# Patient Record
Sex: Female | Born: 1955 | Race: White | Hispanic: No | Marital: Married | Smoking: Never smoker
Health system: Southern US, Community
[De-identification: ages and names within clinical notes are randomized; demographics above are authoritative.]

## PROBLEM LIST (undated history)

## (undated) DIAGNOSIS — I10 Essential (primary) hypertension: Secondary | ICD-10-CM

## (undated) HISTORY — PX: TONSILLECTOMY: SUR1361

## (undated) HISTORY — DX: Essential (primary) hypertension: I10

---

## 1998-01-06 ENCOUNTER — Encounter: Admission: RE | Admit: 1998-01-06 | Discharge: 1998-01-06 | Payer: Self-pay | Admitting: Family Medicine

## 1998-06-09 ENCOUNTER — Encounter: Admission: RE | Admit: 1998-06-09 | Discharge: 1998-06-09 | Payer: Self-pay | Admitting: Family Medicine

## 1998-06-30 ENCOUNTER — Encounter: Admission: RE | Admit: 1998-06-30 | Discharge: 1998-06-30 | Payer: Self-pay | Admitting: Family Medicine

## 1999-10-28 ENCOUNTER — Encounter: Admission: RE | Admit: 1999-10-28 | Discharge: 1999-10-28 | Payer: Self-pay | Admitting: Family Medicine

## 2000-09-27 ENCOUNTER — Encounter: Admission: RE | Admit: 2000-09-27 | Discharge: 2000-09-27 | Payer: Self-pay | Admitting: Family Medicine

## 2000-10-01 ENCOUNTER — Encounter: Payer: Self-pay | Admitting: *Deleted

## 2000-10-01 ENCOUNTER — Ambulatory Visit (HOSPITAL_COMMUNITY): Admission: RE | Admit: 2000-10-01 | Discharge: 2000-10-01 | Payer: Self-pay | Admitting: *Deleted

## 2000-10-04 ENCOUNTER — Encounter: Admission: RE | Admit: 2000-10-04 | Discharge: 2000-10-04 | Payer: Self-pay | Admitting: Family Medicine

## 2001-06-19 ENCOUNTER — Encounter (INDEPENDENT_AMBULATORY_CARE_PROVIDER_SITE_OTHER): Payer: Self-pay | Admitting: *Deleted

## 2001-06-19 LAB — CONVERTED CEMR LAB

## 2001-06-28 ENCOUNTER — Encounter: Payer: Self-pay | Admitting: Family Medicine

## 2001-06-28 ENCOUNTER — Encounter: Admission: RE | Admit: 2001-06-28 | Discharge: 2001-06-28 | Payer: Self-pay | Admitting: Family Medicine

## 2001-07-02 ENCOUNTER — Encounter: Admission: RE | Admit: 2001-07-02 | Discharge: 2001-07-02 | Payer: Self-pay | Admitting: Family Medicine

## 2001-07-02 ENCOUNTER — Encounter: Payer: Self-pay | Admitting: Family Medicine

## 2001-07-17 ENCOUNTER — Encounter: Admission: RE | Admit: 2001-07-17 | Discharge: 2001-07-17 | Payer: Self-pay | Admitting: Family Medicine

## 2002-01-29 ENCOUNTER — Encounter: Admission: RE | Admit: 2002-01-29 | Discharge: 2002-01-29 | Payer: Self-pay | Admitting: Family Medicine

## 2002-07-25 ENCOUNTER — Encounter: Admission: RE | Admit: 2002-07-25 | Discharge: 2002-07-25 | Payer: Self-pay | Admitting: Family Medicine

## 2003-01-21 ENCOUNTER — Encounter: Admission: RE | Admit: 2003-01-21 | Discharge: 2003-01-21 | Payer: Self-pay | Admitting: Family Medicine

## 2003-07-08 ENCOUNTER — Encounter: Admission: RE | Admit: 2003-07-08 | Discharge: 2003-07-08 | Payer: Self-pay | Admitting: Family Medicine

## 2003-12-18 ENCOUNTER — Encounter: Admission: RE | Admit: 2003-12-18 | Discharge: 2003-12-18 | Payer: Self-pay | Admitting: Family Medicine

## 2004-12-21 ENCOUNTER — Ambulatory Visit: Payer: Self-pay | Admitting: Family Medicine

## 2005-01-18 ENCOUNTER — Ambulatory Visit: Payer: Self-pay | Admitting: Family Medicine

## 2006-11-15 DIAGNOSIS — N951 Menopausal and female climacteric states: Secondary | ICD-10-CM

## 2006-11-15 DIAGNOSIS — G43909 Migraine, unspecified, not intractable, without status migrainosus: Secondary | ICD-10-CM | POA: Insufficient documentation

## 2006-11-15 DIAGNOSIS — D259 Leiomyoma of uterus, unspecified: Secondary | ICD-10-CM

## 2006-11-15 DIAGNOSIS — N920 Excessive and frequent menstruation with regular cycle: Secondary | ICD-10-CM

## 2006-11-15 DIAGNOSIS — G44209 Tension-type headache, unspecified, not intractable: Secondary | ICD-10-CM

## 2006-11-16 ENCOUNTER — Encounter (INDEPENDENT_AMBULATORY_CARE_PROVIDER_SITE_OTHER): Payer: Self-pay | Admitting: *Deleted

## 2012-10-18 ENCOUNTER — Other Ambulatory Visit: Payer: Self-pay

## 2017-07-12 ENCOUNTER — Ambulatory Visit (HOSPITAL_COMMUNITY)
Admission: EM | Admit: 2017-07-12 | Discharge: 2017-07-12 | Disposition: A | Discharge status: Home / Self Care | Payer: Self-pay | Attending: Nurse Practitioner | Admitting: Nurse Practitioner

## 2017-07-12 ENCOUNTER — Encounter (HOSPITAL_COMMUNITY): Payer: Self-pay | Admitting: Family Medicine

## 2017-07-12 DIAGNOSIS — I160 Hypertensive urgency: Secondary | ICD-10-CM

## 2017-07-12 MED ORDER — LOSARTAN POTASSIUM-HCTZ 50-12.5 MG PO TABS: 1.0000 | ORAL_TABLET | Freq: Every day | ORAL | 0 refills | Status: DC

## 2017-07-12 MED ORDER — ATENOLOL 25 MG PO TABS
25.0000 mg | ORAL_TABLET | Freq: Once | ORAL | Status: AC
  Administered 2017-07-12: 25 mg via ORAL

## 2017-07-12 MED ORDER — CLONIDINE HCL 0.1 MG PO TABS
0.2000 mg | ORAL_TABLET | Freq: Once | ORAL | Status: AC
  Administered 2017-07-12: 0.2 mg via ORAL

## 2017-07-12 MED ORDER — CLONIDINE HCL 0.1 MG PO TABS
ORAL_TABLET | ORAL | Status: AC
  Filled 2017-07-12: qty 1

## 2017-07-12 MED ORDER — ATENOLOL 25 MG PO TABS
ORAL_TABLET | ORAL | Status: AC
  Filled 2017-07-12: qty 1

## 2017-07-12 NOTE — Discharge Instructions (Signed)
Start your high blood pressure medication tomorrow morning. Check your blood pressure at least twice daily and create a log. Take the log with you to your doctor's office. Return here or go to the ED if the top number of your blood pressure is higher than 180. Go to the emergency department immediately if you experience extreme dizziness, severe headache, weakness, difficulty speaking or any other concerning symptoms.

## 2017-07-12 NOTE — ED Provider Notes (Signed)
Paula Ali    CSN: [id number] Arrival date & time: 07/12/17  1547     History   Chief Complaint Chief Complaint  Patient presents with  . Hypertension    HPI Paula Ali is a 61 y.o. female.     Patient presents for evaluation of elevated BP measurement. Patient reports that she has never been officially diagnosed with hypertension but monitors her blood pressure closely. She checks her BP 2-3 times daily. Normally her systolic blood pressure runs in the 140s to 150s. Over the past few days, she's noticed that her systolic blood pressure has significantly increased up to the 200s. She endorses a mild headache. Denies blurred vision, chest pain, dyspnea, palpitations, peripheral edema or limb paresthesias. Cardiovascular risk factors are none. Family history is positive for hypertension and cardiovascular disease.         History reviewed. No pertinent past medical history.  Patient Active Problem List   Diagnosis Date Noted  . 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 TENSION HEADACHE 11/15/2006  . MIGRAINE, UNSPEC., W/O INTRACTABLE MIGRAINE 11/15/2006  . MENORRHAGIA 11/15/2006  . MENOPAUSAL SYNDROME 11/15/2006    History reviewed. No pertinent surgical history.  OB History    No data available       Home Medications    Prior to Admission medications   Not on File    Family History History reviewed. No pertinent family history.  Social History Social History  Substance Use Topics  . Smoking status: Not on file  . Smokeless tobacco: Not on file  . Alcohol use Not on file     Allergies   Amoxicillin   Review of Systems Review of Systems   Physical Exam Triage Vital Signs ED Triage Vitals [07/12/17 1610]  Enc Vitals Group     BP (!) 232/126     Pulse Rate (!) 103     Resp 18     Temp 98 F (36.7 C)     Temp src      SpO2 97 %     Weight      Height      Head Circumference      Peak Flow      Pain Score      Pain Loc    Pain Edu?      Excl. in [place]?    No data found.   Updated Vital Signs BP (!) 232/126   Pulse (!) 103   Temp 98 F (36.7 C)   Resp 18   SpO2 97%   Visual Acuity Right Eye Distance:   Left Eye Distance:   Bilateral Distance:    Right Eye Near:   Left Eye Near:    Bilateral Near:     Physical Exam   UC Treatments / Results  Labs (all labs ordered are listed, but only abnormal results are displayed) Labs Reviewed - No data to display  EKG  EKG Interpretation None       Radiology No results found.  Procedures Procedures (including critical care time)  Medications Ordered in UC Medications  cloNIDine (CATAPRES) tablet 0.2 mg (0.2 mg Oral Given 07/12/17 1616)     Initial Impression / Assessment and Plan / UC Course  I have reviewed the triage vital signs and the nursing notes.  Pertinent labs & imaging results that were available during my care of the patient were reviewed by me and considered in my medical decision making (see chart for details).     61 year old  female presenting with elevated BPs. No prior history of hypertension. BP at clinic arrival was 232/126. She was given clonidine 0.2 mg PO and atenolol 25 mg PO. Her blood pressure is coming down some but still significantly elevated. Advised the patient that she may need to go to the emergency department for IV medications for treatment of hypertensive urgency. The patient is uninsured and reports that she cannot afford to go to the emergency department. We will recheck the blood pressure in 30 minutes and reassess. BP now down to 168/93. Will discharge home with close outpatient follow-up.  The patient was encouraged to continue checking her blood pressure at least twice daily and creating a log. She will be prescribed losartan/hydrochlorothiazide 50/12.5 mg PO daily. She does not have a PCP. She was [facility] to [facility] to establish primary care. She was advised to report to the  emergency room or urgent care immediately should her systolic blood pressure be higher than 180 in spite of medications. Discussed cardiovascular and stroke risk factors. Advised to report to the emergency room immediately should she have any unilateral weakness, slurred speech, facial droop, extreme dizziness, severe headaches or any concerning symptoms.  Discussed diagnosis and treatment with patient. All questions have been answered and all concerns have been addressed. The patient verbalized understanding and had no further questions   Final Clinical Impressions(s) / UC Diagnoses   Final diagnoses:  Hypertensive urgency    New Prescriptions New Prescriptions   No medications on file     Controlled Substance Prescriptions [place] Controlled Substance Registry consulted? Not Applicable   Paula Ali, [place] 07/12/17 1728

## 2017-07-12 NOTE — ED Triage Notes (Signed)
Pt here for hypertension. Reports that she has been recording it today and she has multiple readings over 395 systolic. Reports her normal is around 14o sys. sts that she was dizzy today which made her check it multiple times.  Reports she doesn't take any BP meds. Denies chest pain, SOB.

## 2017-07-25 ENCOUNTER — Encounter: Payer: Self-pay | Admitting: Physician Assistant

## 2017-07-25 ENCOUNTER — Ambulatory Visit: Payer: Self-pay | Attending: Internal Medicine | Admitting: Physician Assistant

## 2017-07-25 VITALS — BP 171/76 | HR 91 | Temp 98.3°F | Resp 18 | Ht 67.0 in | Wt 162.4 lb

## 2017-07-25 DIAGNOSIS — I10 Essential (primary) hypertension: Secondary | ICD-10-CM | POA: Insufficient documentation

## 2017-07-25 DIAGNOSIS — Z5189 Encounter for other specified aftercare: Secondary | ICD-10-CM | POA: Insufficient documentation

## 2017-07-25 DIAGNOSIS — Z880 Allergy status to penicillin: Secondary | ICD-10-CM | POA: Insufficient documentation

## 2017-07-25 MED ORDER — LOSARTAN POTASSIUM-HCTZ 100-25 MG PO TABS: 1.0000 | ORAL_TABLET | Freq: Every day | ORAL | 3 refills | Status: DC

## 2017-07-25 NOTE — Patient Instructions (Addendum)
Check blood pressure daily and record and bring recordings to your next visit.     Hypertension Hypertension, commonly called high blood pressure, is when the force of blood pumping through the arteries is too strong. The arteries are the blood vessels that carry blood from the heart throughout the body. Hypertension forces the heart to work harder to pump blood and may cause arteries to become narrow or stiff. Having untreated or uncontrolled hypertension can cause heart attacks, strokes, kidney disease, and other problems. A blood pressure reading consists of a higher number over a lower number. Ideally, your blood pressure should be below 120/80. The first ("top") number is called the systolic pressure. It is a measure of the pressure in your arteries as your heart beats. The second ("bottom") number is called the diastolic pressure. It is a measure of the pressure in your arteries as the heart relaxes. What are the causes? The cause of this condition is not known. What increases the risk? Some risk factors for high blood pressure are under your control. Others are not. Factors you can change  Smoking.  Having type 2 diabetes mellitus, high cholesterol, or both.  Not getting enough exercise or physical activity.  Being overweight.  Having too much fat, sugar, calories, or salt (sodium) in your diet.  Drinking too much alcohol. Factors that are difficult or impossible to change  Having chronic kidney disease.  Having a family history of high blood pressure.  Age. Risk increases with age.  Race. You may be at higher risk if you are African-American.  Gender. Men are at higher risk than women before age 9. After age 32, women are at higher risk than men.  Having obstructive sleep apnea.  Stress. What are the signs or symptoms? Extremely high blood pressure (hypertensive crisis) may cause:  Headache.  Anxiety.  Shortness of breath.  Nosebleed.  Nausea and  vomiting.  Severe chest pain.  Jerky movements you cannot control (seizures).  How is this diagnosed? This condition is diagnosed by measuring your blood pressure while you are seated, with your arm resting on a surface. The cuff of the blood pressure monitor will be placed directly against the skin of your upper arm at the level of your heart. It should be measured at least twice using the same arm. Certain conditions can cause a difference in blood pressure between your right and left arms. Certain factors can cause blood pressure readings to be lower or higher than normal (elevated) for a short period of time:  When your blood pressure is higher when you are in a health care provider's office than when you are at home, this is called white coat hypertension. Most people with this condition do not need medicines.  When your blood pressure is higher at home than when you are in a health care provider's office, this is called masked hypertension. Most people with this condition may need medicines to control blood pressure.  If you have a high blood pressure reading during one visit or you have normal blood pressure with other risk factors:  You may be asked to return on a different day to have your blood pressure checked again.  You may be asked to monitor your blood pressure at home for 1 week or longer.  If you are diagnosed with hypertension, you may have other blood or imaging tests to help your health care provider understand your overall risk for other conditions. How is this treated? This condition is treated by  making healthy lifestyle changes, such as eating healthy foods, exercising more, and reducing your alcohol intake. Your health care provider may prescribe medicine if lifestyle changes are not enough to get your blood pressure under control, and if:  Your systolic blood pressure is above 130.  Your diastolic blood pressure is above 80.  Your personal target blood pressure  may vary depending on your medical conditions, your age, and other factors. Follow these instructions at home: Eating and drinking  Eat a diet that is high in fiber and potassium, and low in sodium, added sugar, and fat. An example eating plan is called the DASH (Dietary Approaches to Stop Hypertension) diet. To eat this way: ? Eat plenty of fresh fruits and vegetables. Try to fill half of your plate at each meal with fruits and vegetables. ? Eat whole grains, such as whole wheat pasta, brown rice, or whole grain bread. Fill about one quarter of your plate with whole grains. ? Eat or drink low-fat dairy products, such as skim milk or low-fat yogurt. ? Avoid fatty cuts of meat, processed or cured meats, and poultry with skin. Fill about one quarter of your plate with lean proteins, such as fish, chicken without skin, beans, eggs, and tofu. ? Avoid premade and processed foods. These tend to be higher in sodium, added sugar, and fat.  Reduce your daily sodium intake. Most people with hypertension should eat less than 1,500 mg of sodium a day.  Limit alcohol intake to no more than 1 drink a day for nonpregnant women and 2 drinks a day for men. One drink equals 12 oz of beer, 5 oz of wine, or 1 oz of hard liquor. Lifestyle  Work with your health care provider to maintain a healthy body weight or to lose weight. Ask what an ideal weight is for you.  Get at least 30 minutes of exercise that causes your heart to beat faster (aerobic exercise) most days of the week. Activities may include walking, swimming, or biking.  Include exercise to strengthen your muscles (resistance exercise), such as pilates or lifting weights, as part of your weekly exercise routine. Try to do these types of exercises for 30 minutes at least 3 days a week.  Do not use any products that contain nicotine or tobacco, such as cigarettes and e-cigarettes. If you need help quitting, ask your health care provider.  Monitor your  blood pressure at home as told by your health care provider.  Keep all follow-up visits as told by your health care provider. This is important. Medicines  Take over-the-counter and prescription medicines only as told by your health care provider. Follow directions carefully. Blood pressure medicines must be taken as prescribed.  Do not skip doses of blood pressure medicine. Doing this puts you at risk for problems and can make the medicine less effective.  Ask your health care provider about side effects or reactions to medicines that you should watch for. Contact a health care provider if:  You think you are having a reaction to a medicine you are taking.  You have headaches that keep coming back (recurring).  You feel dizzy.  You have swelling in your ankles.  You have trouble with your vision. Get help right away if:  You develop a severe headache or confusion.  You have unusual weakness or numbness.  You feel faint.  You have severe pain in your chest or abdomen.  You vomit repeatedly.  You have trouble breathing. Summary  Hypertension is  when the force of blood pumping through your arteries is too strong. If this condition is not controlled, it may put you at risk for serious complications.  Your personal target blood pressure may vary depending on your medical conditions, your age, and other factors. For most people, a normal blood pressure is less than 120/80.  Hypertension is treated with lifestyle changes, medicines, or a combination of both. Lifestyle changes include weight loss, eating a healthy, low-sodium diet, exercising more, and limiting alcohol. This information is not intended to replace advice given to you by your health care provider. Make sure you discuss any questions you have with your health care provider. Document Released: 09/04/2005 Document Revised: 08/02/2016 Document Reviewed: 08/02/2016 Elsevier Interactive Patient Education  [employer].

## 2017-07-25 NOTE — Progress Notes (Addendum)
Patient ID: Paula Ali, female   DOB: 09-Sep-1956, 61 y.o.   MRN: [id number]   Paula Ali, is a 61 y.o. female  QQP:[id number]  [id number]  DOB - [birth year]  Subjective:  Chief Complaint and HPI: Paula Ali is a 61 y.o. female here today to establish care and for a follow up visit After being seen in the [facility] 07/12/2017 for very high BP readings and undiagnosed Htn.  She was initially given atenolol and Clonidine and her BP started to come down(232/126 initially down to 168/93).  She was prescribed losartan/hydrochlorothiazide 50/12.5 mg PO daily and told to monitor her BP at home and f/up here.   Prior to a few weeks ago, she had been watching her BP for a couple of years.  She usu got readings 140-160/80s-90s.  Then the readings became even higher over a a few days which is when she went to an Urgent care for HTN.  She denies CP/HA/Dizziness.  She has been tolerating the Losartan/HCT 50/12.5 w/o adverse effects.  BP readings at home now 140s-160/70-90; pulse 65-90.  She has not had a PCP in a long time.    ED/Hospital notes reviewed.   +FH CVD; htn  ROS:   Constitutional:  No f/c, No night sweats, No unexplained weight loss. EENT:  No vision changes, No blurry vision, No hearing changes. No mouth, throat, or ear problems.  Respiratory: No cough, No SOB Cardiac: No CP, no palpitations GI:  No abd pain, No N/V/D. GU: No Urinary s/sx Musculoskeletal: No joint pain Neuro: No headache, no dizziness, no motor weakness.  Skin: No rash Endocrine:  No polydipsia. No polyuria.  Psych: Denies SI/HI  No problems updated.  ALLERGIES: Allergies  Allergen Reactions  . Amoxicillin     PAST MEDICAL HISTORY: Past Medical History:  Diagnosis Date  . Hypertension     MEDICATIONS AT HOME: Prior to Admission medications   Medication Sig Start Date End Date Taking? Authorizing Provider  losartan-hydrochlorothiazide (HYZAAR) 100-25 MG tablet Take 1 tablet daily by  mouth. 07/25/17   Argentina Donovan, PA-C     Objective:  EXAM:   Vitals:   07/25/17 1451  BP: (!) 171/76  Pulse: 91  Resp: 18  Temp: 98.3 F (36.8 C)  TempSrc: Oral  SpO2: 91%  Weight: 162 lb 6.4 oz (73.7 kg)  Height: 5\' 7"  (1.702 m)    General appearance : A&OX3. NAD. Non-toxic-appearing HEENT: Atraumatic and Normocephalic.  PERRLA. EOM intact.  Neck: supple, no JVD. No cervical lymphadenopathy. No thyromegaly Chest/Lungs:  Breathing-non-labored, Good air entry bilaterally, breath sounds normal without rales, rhonchi, or wheezing  CVS: S1 S2 regular, no murmurs, gallops, rubs  Extremities: Bilateral Lower Ext shows no edema, both legs are warm to touch with = pulse throughout Neurology:  CN II-XII grossly intact, Non focal.   Psych:  TP linear. J/I WNL. Normal speech. Appropriate eye contact and affect.  Skin:  No Rash  Data Review No results found for: HGBA1C   Assessment & Plan   1. Hypertension, unspecified type Uncontrolled but improving - Basic metabolic panel - TSH Increase dose- losartan-hydrochlorothiazide (HYZAAR) 100-25 MG tablet; Take 1 tablet daily by mouth.  Dispense: 90 tablet; Refill: 3 Continue to check BP OOO and record daily.  Bring to your next visit.   We have discussed target BP range and blood pressure goal. I have advised patient to check BP regularly and to call us back or report to clinic if the numbers are consistently  higher than 140/90. We discussed the importance of compliance with medical therapy and DASH diet recommended, consequences of uncontrolled hypertension discussed.   Patient have been counseled extensively about nutrition and exercise  Return in about 4 weeks (around 08/22/2017) for establish care/assign PCP; f/up htn.  The patient was given clear instructions to go to ER or return to medical center if symptoms don't improve, worsen or new problems develop. The patient verbalized understanding. The patient was told to call to get  lab results if they haven't heard anything in the next week.     Freeman Caldron, PA-C [facility] and [facility], [place]   07/25/2017, 3:38 PM

## 2017-07-26 LAB — BASIC METABOLIC PANEL
BUN/Creatinine Ratio: 17 (ref 12–28)
BUN: 11 mg/dL (ref 8–27)
CALCIUM: 9.7 mg/dL (ref 8.7–10.3)
CO2: 25 mmol/L (ref 20–29)
CREATININE: 0.64 mg/dL (ref 0.57–1.00)
Chloride: 95 mmol/L — ABNORMAL LOW (ref 96–106)
GFR calc Af Amer: 111 mL/min/{1.73_m2} (ref >59)
GFR calc non Af Amer: 96 mL/min/{1.73_m2} (ref >59)
GLUCOSE: 91 mg/dL (ref 65–99)
Potassium: 4.1 mmol/L (ref 3.5–5.2)
Sodium: 132 mmol/L — ABNORMAL LOW (ref 134–144)

## 2017-07-26 LAB — TSH: TSH: 1.5 u[IU]/mL (ref 0.450–4.500)

## 2017-07-27 ENCOUNTER — Other Ambulatory Visit: Payer: Self-pay | Admitting: Pharmacist

## 2017-07-27 DIAGNOSIS — I10 Essential (primary) hypertension: Secondary | ICD-10-CM

## 2017-07-27 MED ORDER — LOSARTAN POTASSIUM-HCTZ 100-25 MG PO TABS: 1.0000 | ORAL_TABLET | Freq: Every day | ORAL | 3 refills | Status: DC

## 2017-07-27 MED FILL — LOSARTAN-HCTZ 100-25 MG TAB: 100-25 | 30 days supply | Qty: 30 | Fill #0

## 2017-07-31 ENCOUNTER — Telehealth (INDEPENDENT_AMBULATORY_CARE_PROVIDER_SITE_OTHER): Payer: Self-pay | Admitting: *Deleted

## 2017-07-31 NOTE — Telephone Encounter (Signed)
Medical Assistant left message on patient's home and cell voicemail. Voicemail states to give a call back to [place] with [facility] at [phone]. Patient is aware of sodium beng low and monitored. Patient is aware of all other labs being normal.

## 2017-07-31 NOTE — Telephone Encounter (Signed)
-----   Message from Argentina Donovan, [place] sent at 07/26/2017  8:36 AM EST ----- Please call patient.  Her sodium was a little low.  Nothing needs to be added/done for this. We will continue to monitor this.  Her potassium, blood sugar, kidney function, and thyroid studies were all normal.  Follow-up as planned. Thanks, Freeman Caldron, PA-C

## 2017-08-24 ENCOUNTER — Encounter: Payer: Self-pay | Admitting: Nurse Practitioner

## 2017-08-24 ENCOUNTER — Ambulatory Visit: Payer: Self-pay | Attending: Nurse Practitioner | Admitting: Nurse Practitioner

## 2017-08-24 VITALS — BP 168/77 | HR 91 | Temp 97.8°F | Ht 67.0 in | Wt 160.4 lb

## 2017-08-24 DIAGNOSIS — Z8249 Family history of ischemic heart disease and other diseases of the circulatory system: Secondary | ICD-10-CM | POA: Insufficient documentation

## 2017-08-24 DIAGNOSIS — Z79899 Other long term (current) drug therapy: Secondary | ICD-10-CM | POA: Insufficient documentation

## 2017-08-24 DIAGNOSIS — Z880 Allergy status to penicillin: Secondary | ICD-10-CM | POA: Insufficient documentation

## 2017-08-24 DIAGNOSIS — L409 Psoriasis, unspecified: Secondary | ICD-10-CM | POA: Insufficient documentation

## 2017-08-24 DIAGNOSIS — Z0000 Encounter for general adult medical examination without abnormal findings: Secondary | ICD-10-CM

## 2017-08-24 DIAGNOSIS — R51 Headache: Secondary | ICD-10-CM | POA: Insufficient documentation

## 2017-08-24 DIAGNOSIS — I10 Essential (primary) hypertension: Secondary | ICD-10-CM | POA: Insufficient documentation

## 2017-08-24 MED ORDER — CLOBETASOL PROPIONATE 0.05 % EX CREA: 1.0000 "application " | TOPICAL_CREAM | Freq: Two times a day (BID) | CUTANEOUS | 1 refills | Status: DC

## 2017-08-24 MED ORDER — AMLODIPINE BESYLATE 5 MG PO TABS: 5.0000 mg | ORAL_TABLET | Freq: Every day | ORAL | 1 refills | Status: DC

## 2017-08-24 NOTE — Progress Notes (Signed)
Assessment & Plan:  Paula Ali was seen today for follow-up.  Diagnoses and all orders for this visit:  Essential hypertension -     CBC -     CMP14+EGFR -     Lipid panel -     amLODipine (NORVASC) 5 MG tablet; Take 1 tablet (5 mg total) by mouth daily. Continue all antihypertensives as prescribed.  Remember to bring in your blood pressure log with you for your follow up appointment.  DASH/Mediterranean Diets are healthier choices for HTN.   Routine health maintenance -     VITAMIN D 25 Hydroxy (Vit-D Deficiency, Fractures)  Psoriasis -     clobetasol cream (TEMOVATE) 0.05 %; Apply 1 application topically 2 (two) times daily.    Patient has been counseled on age-appropriate routine health concerns for screening and prevention. These are reviewed and up-to-date. Referrals have been placed accordingly. Immunizations are up-to-date or declined.   Patient states "I just want to work on my blood pressure right now. Let's hold off on all these referrals".  Subjective:   Chief Complaint  Patient presents with  . Follow-up    Patient is here for follow-up for blood pressure. Patient would like to establish care.    HPI Paula Ali 61 y.o. female presents to office today to establish care and for medication refills.    Hypertension Patient reports she has been "somewhat" hypertensive for the past 5 years but never diagnosed. She has attempted to control her blood pressure with exercise and alternative therapies including: hawthorn supplements and beets. Unfortunately her blood pressure has fluctuated over the years with average 140-150/80s. She has a blood pressure log with her today with multiple readings each day since she was placed on losartan-hctz on 07-12-2017. On that day she had been evaluated in the ED for hypertensive urgency after complaining of headaches. She has no significant PMH aside from psoriasis. [facility] is positive for HTN in her mother. She does state her mother  was eventually taken off of her blood pressure medication. While in the ED she was treated with clonodine and atenolol and discharged home on losartan/hctz 50/12.5 and instructions to establish care with a provider and follow up for her blood pressure. She was seen in this office by another provider on 07-25-2017 and was noted for continued HTN. Losartan/Hctz was then increased to 100/25. Today she is here for follow up to that office visit and to establish care with me. She endorses dry mouth (likely from blood pressure medication.).  Drinks gatorade and reports symptoms of dry mouth resolve. She was hyponatremic in the ED so will recheck electrolytes. As she is still not at goal with her BP today will add a small dose of amlodipine. She is not a smoker.   Psoriasis Patient complains of psoriasis.  Symptoms have been ongoing for about a few years and have been stable. The patient reports symptoms of itching, scaling, primarily affecting the scalp, hands. Lesions appear to be exacerbated by no known precipitant. Treatments tried so far include topical steroid (clobetasol), result clearing, with good improvement. History of other significant skin problems: no. Family History of skin disease: no.   Review of Systems  Constitutional: Negative for fever, malaise/fatigue and weight loss.  HENT: Negative.  Negative for nosebleeds.   Eyes: Negative.  Negative for blurred vision, double vision and photophobia.  Respiratory: Negative.  Negative for cough and shortness of breath.   Cardiovascular: Negative.  Negative for chest pain, palpitations and leg swelling.  Gastrointestinal: Negative.  Negative for abdominal pain, constipation, diarrhea, heartburn, nausea and vomiting.  Musculoskeletal: Negative.  Negative for myalgias.  Neurological: Negative.  Negative for dizziness, focal weakness, seizures and headaches.  Endo/Heme/Allergies: Negative for environmental allergies.  Psychiatric/Behavioral: Negative.   Negative for suicidal ideas.    Past Medical History:  Diagnosis Date  . Hypertension     History reviewed. No pertinent surgical history.  History reviewed. No pertinent family history.  Social History Reviewed with no changes to be made today.   Outpatient Medications Prior to Visit  Medication Sig Dispense Refill  . losartan-hydrochlorothiazide (HYZAAR) 100-25 MG tablet Take 1 tablet daily by mouth. 90 tablet 3   No facility-administered medications prior to visit.     Allergies  Allergen Reactions  . Amoxicillin        Objective:    BP (!) 168/77 (BP Location: Left Arm, Patient Position: Sitting, Cuff Size: Normal)   Pulse 91   Temp 97.8 F (36.6 C) (Oral)   Ht 5' 7" (1.702 m)   Wt 160 lb 6.4 oz (72.8 kg)   SpO2 100%   BMI 25.12 kg/m  Wt Readings from Last 3 Encounters:  08/24/17 160 lb 6.4 oz (72.8 kg)  07/25/17 162 lb 6.4 oz (73.7 kg)    Physical Exam  Constitutional: She is oriented to person, place, and time. She appears well-developed and well-nourished. She is cooperative.  HENT:  Head: Normocephalic and atraumatic.    Eyes: EOM are normal.  Neck: Normal range of motion.  Cardiovascular: Normal rate, regular rhythm, normal heart sounds and intact distal pulses. Exam reveals no gallop and no friction rub.  No murmur heard. Pulmonary/Chest: Effort normal and breath sounds normal. No tachypnea. No respiratory distress. She has no decreased breath sounds. She has no wheezes. She has no rhonchi. She has no rales. She exhibits no tenderness.  Abdominal: Soft. Bowel sounds are normal.  Musculoskeletal: Normal range of motion. She exhibits no edema.  Neurological: She is alert and oriented to person, place, and time. Coordination normal.  Skin: Skin is warm and dry.  Psychiatric: She has a normal mood and affect. Her behavior is normal. Judgment and thought content normal.  Nursing note and vitals reviewed.      Patient has been counseled extensively  about nutrition and exercise as well as the importance of adherence with medications and regular follow-up. The patient was given clear instructions to go to ER or return to medical center if symptoms don't improve, worsen or new problems develop. The patient verbalized understanding.   Follow-up: Return in about 2 weeks (around 09/07/2017) for BP recheck with PCP.   Gildardo Pounds, FNP-BC [facility] [place], [place]   08/24/2017, 1:35 PM

## 2017-08-25 LAB — CMP14+EGFR
A/G RATIO: 2 (ref 1.2–2.2)
ALT: 12 IU/L (ref 0–32)
AST: 15 IU/L (ref 0–40)
Albumin: 4.5 g/dL (ref 3.6–4.8)
Alkaline Phosphatase: 81 IU/L (ref 39–117)
BILIRUBIN TOTAL: 0.4 mg/dL (ref 0.0–1.2)
BUN/Creatinine Ratio: 14 (ref 12–28)
BUN: 11 mg/dL (ref 8–27)
CALCIUM: 9.6 mg/dL (ref 8.7–10.3)
CHLORIDE: 95 mmol/L — AB (ref 96–106)
CO2: 24 mmol/L (ref 20–29)
Creatinine, Ser: 0.77 mg/dL (ref 0.57–1.00)
GFR calc Af Amer: 96 mL/min/{1.73_m2} (ref >59)
GFR, EST NON AFRICAN AMERICAN: 83 mL/min/{1.73_m2} (ref >59)
Globulin, Total: 2.2 g/dL (ref 1.5–4.5)
Glucose: 93 mg/dL (ref 65–99)
POTASSIUM: 4 mmol/L (ref 3.5–5.2)
Sodium: 131 mmol/L — ABNORMAL LOW (ref 134–144)
Total Protein: 6.7 g/dL (ref 6.0–8.5)

## 2017-08-25 LAB — CBC
Hematocrit: 37 % (ref 34.0–46.6)
Hemoglobin: 12.8 g/dL (ref 11.1–15.9)
MCH: 31.7 pg (ref 26.6–33.0)
MCHC: 34.6 g/dL (ref 31.5–35.7)
MCV: 92 fL (ref 79–97)
PLATELETS: 236 10*3/uL (ref 150–379)
RBC: 4.04 x10E6/uL (ref 3.77–5.28)
RDW: 13 % (ref 12.3–15.4)
WBC: 5.2 10*3/uL (ref 3.4–10.8)

## 2017-08-25 LAB — LIPID PANEL
CHOLESTEROL TOTAL: 208 mg/dL — AB (ref 100–199)
Chol/HDL Ratio: 3.5 ratio (ref 0.0–4.4)
HDL: 59 mg/dL (ref >39)
LDL Calculated: 134 mg/dL — ABNORMAL HIGH (ref 0–99)
TRIGLYCERIDES: 76 mg/dL (ref 0–149)
VLDL Cholesterol Cal: 15 mg/dL (ref 5–40)

## 2017-08-25 LAB — VITAMIN D 25 HYDROXY (VIT D DEFICIENCY, FRACTURES): Vit D, 25-Hydroxy: 49.1 ng/mL (ref 30.0–100.0)

## 2017-08-29 ENCOUNTER — Telehealth: Payer: Self-pay

## 2017-08-29 NOTE — Telephone Encounter (Signed)
-----   Message from Gildardo Pounds, NP sent at 08/25/2017 11:43 PM EST ----- Your sodium is still below normal. I would like for you to stop taking the losartan-hctz and continue on the amlodipine/norvasc only. I will follow up with your blood pressure on the 21st. Continue to monitor and log your blood pressures no more than 1-2 times per day.

## 2017-08-29 NOTE — Telephone Encounter (Signed)
Patient informed on lab result and aware to stop taking Losartan-hctz and only amlodipine/norvasc.   Patient verified DOB.

## 2017-09-07 ENCOUNTER — Encounter: Payer: Self-pay | Admitting: Nurse Practitioner

## 2017-09-07 ENCOUNTER — Ambulatory Visit: Payer: Self-pay | Attending: Nurse Practitioner | Admitting: Nurse Practitioner

## 2017-09-07 VITALS — BP 150/84 | HR 90 | Temp 97.8°F | Ht 67.0 in | Wt 162.6 lb

## 2017-09-07 DIAGNOSIS — Z880 Allergy status to penicillin: Secondary | ICD-10-CM | POA: Insufficient documentation

## 2017-09-07 DIAGNOSIS — L409 Psoriasis, unspecified: Secondary | ICD-10-CM

## 2017-09-07 DIAGNOSIS — E871 Hypo-osmolality and hyponatremia: Secondary | ICD-10-CM

## 2017-09-07 DIAGNOSIS — E559 Vitamin D deficiency, unspecified: Secondary | ICD-10-CM | POA: Insufficient documentation

## 2017-09-07 DIAGNOSIS — I10 Essential (primary) hypertension: Secondary | ICD-10-CM

## 2017-09-07 DIAGNOSIS — Z79899 Other long term (current) drug therapy: Secondary | ICD-10-CM | POA: Insufficient documentation

## 2017-09-07 MED ORDER — CLOBETASOL PROPIONATE 0.05 % EX CREA: 1.0000 "application " | TOPICAL_CREAM | Freq: Two times a day (BID) | CUTANEOUS | 1 refills | Status: DC

## 2017-09-07 MED ORDER — METOPROLOL SUCCINATE ER 25 MG PO TB24: 25.0000 mg | ORAL_TABLET | Freq: Every day | ORAL | 1 refills | Status: DC

## 2017-09-07 NOTE — Progress Notes (Signed)
Assessment & Plan:  Paula Ali was seen today for hypertension.  Diagnoses and all orders for this visit:  Hyponatremia -     Basic metabolic panel  Essential hypertension -     metoprolol succinate (TOPROL-XL) 25 MG 24 hr tablet; Take 1 tablet (25 mg total) by mouth daily.  Psoriasis -     clobetasol cream (TEMOVATE) 0.05 %; Apply 1 application topically 2 (two) times daily.    Patient has been counseled on age-appropriate routine health concerns for screening and prevention. These are reviewed and up-to-date. Referrals have been placed accordingly. Immunizations are up-to-date or declined.    Subjective:   Chief Complaint  Patient presents with  . Hypertension    Patient stated her blood pressure has been high since she's been only taking amlodipine. Patient is concern with it.    HPI Paula Ali 61 y.o. female presents to office today for follow up of essential hypertension.   Essential Hypertension Her last office visit with me was on 08-24-2017 to establish care as a new patient and at that time amlodipine was added to her current antihypertensive regimen of hyzaar 100/25mg  due to poorly controlled hypertension despite medication compliance. Blood pressure is still not at goal today. I instructed her at the last office visit on 08-24-2017 to stop hyzaar as she was having increasing hyponatremia. Will recheck sodium today. Blood pressure readings at home have been 140-160/70-80s. Blood pressure is still fairly uncontrolled today. Will add toprol xl. She denies chest pain, shortness of breath, palpitations, lightheadedness, dizziness, headaches or bilateral lower extremity edema.   BP Readings from Last 3 Encounters:  09/07/17 (!) 150/84  08/24/17 (!) 168/77  07/25/17 (!) 171/76    Vitamin D Deficiency She is taking 2000units. Vitamin D is normal as of 08-24-2017.   Review of Systems  Constitutional: Negative for fever, malaise/fatigue and weight loss.  HENT:  Negative.  Negative for nosebleeds.   Eyes: Negative.  Negative for blurred vision, double vision and photophobia.  Respiratory: Negative.  Negative for cough and shortness of breath.   Cardiovascular: Negative.  Negative for chest pain, palpitations and leg swelling.  Gastrointestinal: Negative.  Negative for abdominal pain, constipation, diarrhea, heartburn, nausea and vomiting.  Musculoskeletal: Negative.  Negative for myalgias.  Skin: Positive for itching and rash.  Neurological: Negative.  Negative for dizziness, focal weakness, seizures and headaches.  Endo/Heme/Allergies: Negative for environmental allergies.  Psychiatric/Behavioral: Negative.  Negative for suicidal ideas.    Past Medical History:  Diagnosis Date  . Hypertension     History reviewed. No pertinent surgical history.  History reviewed. No pertinent family history.  Social History Reviewed with no changes to be made today.   Outpatient Medications Prior to Visit  Medication Sig Dispense Refill  . amLODipine (NORVASC) 5 MG tablet Take 1 tablet (5 mg total) by mouth daily. 30 tablet 1  . clobetasol cream (TEMOVATE) 4.62 % Apply 1 application topically 2 (two) times daily. 60 g 1  . losartan-hydrochlorothiazide (HYZAAR) 100-25 MG tablet Take 1 tablet daily by mouth. (Patient not taking: Reported on 09/07/2017) 90 tablet 3   No facility-administered medications prior to visit.     Allergies  Allergen Reactions  . Amoxicillin        Objective:    BP (!) 150/84 (BP Location: Left Arm, Patient Position: Sitting, Cuff Size: Normal)   Pulse 90   Temp 97.8 F (36.6 C) (Oral)   Ht 5\' 7"  (1.702 m)   Wt 162 lb 9.6  oz (73.8 kg)   SpO2 100%   BMI 25.47 kg/m  Wt Readings from Last 3 Encounters:  09/07/17 162 lb 9.6 oz (73.8 kg)  08/24/17 160 lb 6.4 oz (72.8 kg)  07/25/17 162 lb 6.4 oz (73.7 kg)    Physical Exam  Constitutional: She is oriented to person, place, and time. She appears well-developed and  well-nourished. She is cooperative.  HENT:  Head: Normocephalic and atraumatic.  Eyes: EOM are normal.  Neck: Normal range of motion.  Cardiovascular: Normal rate, regular rhythm, normal heart sounds and intact distal pulses. Exam reveals no gallop and no friction rub.  No murmur heard. Pulmonary/Chest: Effort normal and breath sounds normal. No tachypnea. No respiratory distress. She has no decreased breath sounds. She has no wheezes. She has no rhonchi. She has no rales. She exhibits no tenderness.  Abdominal: Soft. Bowel sounds are normal.  Musculoskeletal: Normal range of motion. She exhibits no edema.  Neurological: She is alert and oriented to person, place, and time. Coordination normal.  Skin: Skin is warm and dry.  Psychiatric: She has a normal mood and affect. Her behavior is normal. Judgment and thought content normal.  Nursing note and vitals reviewed.     Patient has been counseled extensively about nutrition and exercise as well as the importance of adherence with medications and regular follow-up. The patient was given clear instructions to go to ER or return to medical center if symptoms don't improve, worsen or new problems develop. The patient verbalized understanding.   Follow-up: Return in about 3 weeks (around 09/28/2017) for BP recheck     Gildardo Pounds, FNP-BC [facility] [place], [place]   09/11/2017, 8:09 PM

## 2017-09-08 LAB — BASIC METABOLIC PANEL
BUN / CREAT RATIO: 12 (ref 12–28)
BUN: 9 mg/dL (ref 8–27)
CHLORIDE: 104 mmol/L (ref 96–106)
CO2: 24 mmol/L (ref 20–29)
CREATININE: 0.78 mg/dL (ref 0.57–1.00)
Calcium: 9.6 mg/dL (ref 8.7–10.3)
GFR calc non Af Amer: 82 mL/min/{1.73_m2} (ref >59)
GFR, EST AFRICAN AMERICAN: 94 mL/min/{1.73_m2} (ref >59)
Glucose: 93 mg/dL (ref 65–99)
Potassium: 4.3 mmol/L (ref 3.5–5.2)
Sodium: 140 mmol/L (ref 134–144)

## 2017-09-19 ENCOUNTER — Telehealth: Payer: Self-pay

## 2017-09-19 NOTE — Telephone Encounter (Signed)
Patient informed on lab result.   Patient verified DOB.

## 2017-09-19 NOTE — Progress Notes (Signed)
Called patient back and informed her PCP message above.

## 2017-09-19 NOTE — Progress Notes (Signed)
Patient stated she is having a weird feeling with her dry mouth. She would like to have her blood sugar check on her next visit. Patient wants to know if she need to fast or anything for it?

## 2017-09-19 NOTE — Telephone Encounter (Signed)
-----   Message from Gildardo Pounds, NP sent at 09/18/2017 10:30 PM EST ----- Please inform patient that laboratory results are normal. Continue healthy eating habit and regular physical exercise at least 150 minutes per week

## 2017-10-05 ENCOUNTER — Encounter: Payer: Self-pay | Admitting: Nurse Practitioner

## 2017-10-05 ENCOUNTER — Ambulatory Visit: Payer: Self-pay | Attending: Nurse Practitioner | Admitting: Nurse Practitioner

## 2017-10-05 VITALS — BP 147/74 | HR 77 | Temp 98.0°F | Ht 67.0 in | Wt 164.2 lb

## 2017-10-05 DIAGNOSIS — Z1211 Encounter for screening for malignant neoplasm of colon: Secondary | ICD-10-CM

## 2017-10-05 DIAGNOSIS — I10 Essential (primary) hypertension: Secondary | ICD-10-CM | POA: Insufficient documentation

## 2017-10-05 DIAGNOSIS — Z880 Allergy status to penicillin: Secondary | ICD-10-CM | POA: Insufficient documentation

## 2017-10-05 DIAGNOSIS — Z79899 Other long term (current) drug therapy: Secondary | ICD-10-CM | POA: Insufficient documentation

## 2017-10-05 MED ORDER — AMLODIPINE BESYLATE 5 MG PO TABS: 5.0000 mg | ORAL_TABLET | Freq: Every day | ORAL | 1 refills | Status: DC

## 2017-10-05 NOTE — Patient Instructions (Addendum)
Hypertension  Hypertension, commonly called high blood pressure, is when the force of blood pumping through the arteries is too strong. The arteries are the blood vessels that carry blood from the heart throughout the body. Hypertension forces the heart to work harder to pump blood and may cause arteries to become narrow or stiff. Having untreated or uncontrolled hypertension can cause heart attacks, strokes, kidney disease, and other problems.  A blood pressure reading consists of a higher number over a lower number. Ideally, your blood pressure should be below 120/80. The first ("top") number is called the systolic pressure. It is a measure of the pressure in your arteries as your heart beats. The second ("bottom") number is called the diastolic pressure. It is a measure of the pressure in your arteries as the heart relaxes.  What are the causes?  The cause of this condition is not known.  What increases the risk?  Some risk factors for high blood pressure are under your control. Others are not.  Factors you can change  · Smoking.  · Having type 2 diabetes mellitus, high cholesterol, or both.  · Not getting enough exercise or physical activity.  · Being overweight.  · Having too much fat, sugar, calories, or salt (sodium) in your diet.  · Drinking too much alcohol.  Factors that are difficult or impossible to change  · Having chronic kidney disease.  · Having a family history of high blood pressure.  · Age. Risk increases with age.  · Race. You may be at higher risk if you are African-American.  · Gender. Men are at higher risk than women before age 45. After age 65, women are at higher risk than men.  · Having obstructive sleep apnea.  · Stress.  What are the signs or symptoms?  Extremely high blood pressure (hypertensive crisis) may cause:  · Headache.  · Anxiety.  · Shortness of breath.  · Nosebleed.  · Nausea and vomiting.  · Severe chest pain.  · Jerky movements you cannot control (seizures).    How is this  diagnosed?  This condition is diagnosed by measuring your blood pressure while you are seated, with your arm resting on a surface. The cuff of the blood pressure monitor will be placed directly against the skin of your upper arm at the level of your heart. It should be measured at least twice using the same arm. Certain conditions can cause a difference in blood pressure between your right and left arms.  Certain factors can cause blood pressure readings to be lower or higher than normal (elevated) for a short period of time:  · When your blood pressure is higher when you are in a health care provider's office than when you are at home, this is called white coat hypertension. Most people with this condition do not need medicines.  · When your blood pressure is higher at home than when you are in a health care provider's office, this is called masked hypertension. Most people with this condition may need medicines to control blood pressure.    If you have a high blood pressure reading during one visit or you have normal blood pressure with other risk factors:  · You may be asked to return on a different day to have your blood pressure checked again.  · You may be asked to monitor your blood pressure at home for 1 week or longer.    If you are diagnosed with hypertension, you may have other blood   or imaging tests to help your health care provider understand your overall risk for other conditions.  How is this treated?  This condition is treated by making healthy lifestyle changes, such as eating healthy foods, exercising more, and reducing your alcohol intake. Your health care provider may prescribe medicine if lifestyle changes are not enough to get your blood pressure under control, and if:  · Your systolic blood pressure is above 130.  · Your diastolic blood pressure is above 80.    Your personal target blood pressure may vary depending on your medical conditions, your age, and other factors.  Follow these  instructions at home:  Eating and drinking  · Eat a diet that is high in fiber and potassium, and low in sodium, added sugar, and fat. An example eating plan is called the DASH (Dietary Approaches to Stop Hypertension) diet. To eat this way:  ? Eat plenty of fresh fruits and vegetables. Try to fill half of your plate at each meal with fruits and vegetables.  ? Eat whole grains, such as whole wheat pasta, brown rice, or whole grain bread. Fill about one quarter of your plate with whole grains.  ? Eat or drink low-fat dairy products, such as skim milk or low-fat yogurt.  ? Avoid fatty cuts of meat, processed or cured meats, and poultry with skin. Fill about one quarter of your plate with lean proteins, such as fish, chicken without skin, beans, eggs, and tofu.  ? Avoid premade and processed foods. These tend to be higher in sodium, added sugar, and fat.  · Reduce your daily sodium intake. Most people with hypertension should eat less than 1,500 mg of sodium a day.  · Limit alcohol intake to no more than 1 drink a day for nonpregnant women and 2 drinks a day for men. One drink equals 12 oz of beer, 5 oz of wine, or 1½ oz of hard liquor.  Lifestyle  · Work with your health care provider to maintain a healthy body weight or to lose weight. Ask what an ideal weight is for you.  · Get at least 30 minutes of exercise that causes your heart to beat faster (aerobic exercise) most days of the week. Activities may include walking, swimming, or biking.  · Include exercise to strengthen your muscles (resistance exercise), such as pilates or lifting weights, as part of your weekly exercise routine. Try to do these types of exercises for 30 minutes at least 3 days a week.  · Do not use any products that contain nicotine or tobacco, such as cigarettes and e-cigarettes. If you need help quitting, ask your health care provider.  · Monitor your blood pressure at home as told by your health care provider.  · Keep all follow-up visits as  told by your health care provider. This is important.  Medicines  · Take over-the-counter and prescription medicines only as told by your health care provider. Follow directions carefully. Blood pressure medicines must be taken as prescribed.  · Do not skip doses of blood pressure medicine. Doing this puts you at risk for problems and can make the medicine less effective.  · Ask your health care provider about side effects or reactions to medicines that you should watch for.  Contact a health care provider if:  · You think you are having a reaction to a medicine you are taking.  · You have headaches that keep coming back (recurring).  · You feel dizzy.  · You have swelling   in your ankles.  · You have trouble with your vision.  Get help right away if:  · You develop a severe headache or confusion.  · You have unusual weakness or numbness.  · You feel faint.  · You have severe pain in your chest or abdomen.  · You vomit repeatedly.  · You have trouble breathing.  Summary  · Hypertension is when the force of blood pumping through your arteries is too strong. If this condition is not controlled, it may put you at risk for serious complications.  · Your personal target blood pressure may vary depending on your medical conditions, your age, and other factors. For most people, a normal blood pressure is less than 120/80.  · Hypertension is treated with lifestyle changes, medicines, or a combination of both. Lifestyle changes include weight loss, eating a healthy, low-sodium diet, exercising more, and limiting alcohol.  This information is not intended to replace advice given to you by your health care provider. Make sure you discuss any questions you have with your health care provider.  Document Released: 09/04/2005 Document Revised: 08/02/2016 Document Reviewed: 08/02/2016  Elsevier Interactive Patient Education © 2018 [employer]    Hypertension  Hypertension, commonly called high blood pressure, is when the force of  blood pumping through the arteries is too strong. The arteries are the blood vessels that carry blood from the heart throughout the body. Hypertension forces the heart to work harder to pump blood and may cause arteries to become narrow or stiff. Having untreated or uncontrolled hypertension can cause heart attacks, strokes, kidney disease, and other problems.  A blood pressure reading consists of a higher number over a lower number. Ideally, your blood pressure should be below 120/80. The first ("top") number is called the systolic pressure. It is a measure of the pressure in your arteries as your heart beats. The second ("bottom") number is called the diastolic pressure. It is a measure of the pressure in your arteries as the heart relaxes.  What are the causes?  The cause of this condition is not known.  What increases the risk?  Some risk factors for high blood pressure are under your control. Others are not.  Factors you can change  · Smoking.  · Having type 2 diabetes mellitus, high cholesterol, or both.  · Not getting enough exercise or physical activity.  · Being overweight.  · Having too much fat, sugar, calories, or salt (sodium) in your diet.  · Drinking too much alcohol.  Factors that are difficult or impossible to change  · Having chronic kidney disease.  · Having a family history of high blood pressure.  · Age. Risk increases with age.  · Race. You may be at higher risk if you are African-American.  · Gender. Men are at higher risk than women before age 45. After age 65, women are at higher risk than men.  · Having obstructive sleep apnea.  · Stress.  What are the signs or symptoms?  Extremely high blood pressure (hypertensive crisis) may cause:  · Headache.  · Anxiety.  · Shortness of breath.  · Nosebleed.  · Nausea and vomiting.  · Severe chest pain.  · Jerky movements you cannot control (seizures).    How is this diagnosed?  This condition is diagnosed by measuring your blood pressure while you are  seated, with your arm resting on a surface. The cuff of the blood pressure monitor will be placed directly against the skin of your   upper arm at the level of your heart. It should be measured at least twice using the same arm. Certain conditions can cause a difference in blood pressure between your right and left arms.  Certain factors can cause blood pressure readings to be lower or higher than normal (elevated) for a short period of time:  · When your blood pressure is higher when you are in a health care provider's office than when you are at home, this is called white coat hypertension. Most people with this condition do not need medicines.  · When your blood pressure is higher at home than when you are in a health care provider's office, this is called masked hypertension. Most people with this condition may need medicines to control blood pressure.    If you have a high blood pressure reading during one visit or you have normal blood pressure with other risk factors:  · You may be asked to return on a different day to have your blood pressure checked again.  · You may be asked to monitor your blood pressure at home for 1 week or longer.    If you are diagnosed with hypertension, you may have other blood or imaging tests to help your health care provider understand your overall risk for other conditions.  How is this treated?  This condition is treated by making healthy lifestyle changes, such as eating healthy foods, exercising more, and reducing your alcohol intake. Your health care provider may prescribe medicine if lifestyle changes are not enough to get your blood pressure under control, and if:  · Your systolic blood pressure is above 130.  · Your diastolic blood pressure is above 80.    Your personal target blood pressure may vary depending on your medical conditions, your age, and other factors.  Follow these instructions at home:  Eating and drinking  · Eat a diet that is high in fiber and potassium,  and low in sodium, added sugar, and fat. An example eating plan is called the DASH (Dietary Approaches to Stop Hypertension) diet. To eat this way:  ? Eat plenty of fresh fruits and vegetables. Try to fill half of your plate at each meal with fruits and vegetables.  ? Eat whole grains, such as whole wheat pasta, brown rice, or whole grain bread. Fill about one quarter of your plate with whole grains.  ? Eat or drink low-fat dairy products, such as skim milk or low-fat yogurt.  ? Avoid fatty cuts of meat, processed or cured meats, and poultry with skin. Fill about one quarter of your plate with lean proteins, such as fish, chicken without skin, beans, eggs, and tofu.  ? Avoid premade and processed foods. These tend to be higher in sodium, added sugar, and fat.  · Reduce your daily sodium intake. Most people with hypertension should eat less than 1,500 mg of sodium a day.  · Limit alcohol intake to no more than 1 drink a day for nonpregnant women and 2 drinks a day for men. One drink equals 12 oz of beer, 5 oz of wine, or 1½ oz of hard liquor.  Lifestyle  · Work with your health care provider to maintain a healthy body weight or to lose weight. Ask what an ideal weight is for you.  · Get at least 30 minutes of exercise that causes your heart to beat faster (aerobic exercise) most days of the week. Activities may include walking, swimming, or biking.  · Include exercise to strengthen   your muscles (resistance exercise), such as pilates or lifting weights, as part of your weekly exercise routine. Try to do these types of exercises for 30 minutes at least 3 days a week.  · Do not use any products that contain nicotine or tobacco, such as cigarettes and e-cigarettes. If you need help quitting, ask your health care provider.  · Monitor your blood pressure at home as told by your health care provider.  · Keep all follow-up visits as told by your health care provider. This is important.  Medicines  · Take over-the-counter and  prescription medicines only as told by your health care provider. Follow directions carefully. Blood pressure medicines must be taken as prescribed.  · Do not skip doses of blood pressure medicine. Doing this puts you at risk for problems and can make the medicine less effective.  · Ask your health care provider about side effects or reactions to medicines that you should watch for.  Contact a health care provider if:  · You think you are having a reaction to a medicine you are taking.  · You have headaches that keep coming back (recurring).  · You feel dizzy.  · You have swelling in your ankles.  · You have trouble with your vision.  Get help right away if:  · You develop a severe headache or confusion.  · You have unusual weakness or numbness.  · You feel faint.  · You have severe pain in your chest or abdomen.  · You vomit repeatedly.  · You have trouble breathing.  Summary  · Hypertension is when the force of blood pumping through your arteries is too strong. If this condition is not controlled, it may put you at risk for serious complications.  · Your personal target blood pressure may vary depending on your medical conditions, your age, and other factors. For most people, a normal blood pressure is less than 120/80.  · Hypertension is treated with lifestyle changes, medicines, or a combination of both. Lifestyle changes include weight loss, eating a healthy, low-sodium diet, exercising more, and limiting alcohol.  This information is not intended to replace advice given to you by your health care provider. Make sure you discuss any questions you have with your health care provider.  Document Released: 09/04/2005 Document Revised: 08/02/2016 Document Reviewed: 08/02/2016  Elsevier Interactive Patient Education © 2018 [employer]

## 2017-10-05 NOTE — Progress Notes (Addendum)
Assessment & Plan:  Mckenleigh was seen today for follow-up.  Diagnoses and all orders for this visit:  Essential hypertension -     amLODipine (NORVASC) 5 MG tablet; Take 1 tablet (5 mg total) by mouth daily.   Patient has been counseled on age-appropriate routine health concerns for screening and prevention. These are reviewed and up-to-date. Referrals have been placed accordingly. Immunizations are up-to-date or declined.    Subjective:   Chief Complaint  Patient presents with  . Follow-up    Patient is here for a folllow-up on blood pressure check. Patient bought her blood pressure meter with her for PCP.    HPI Paula Ali 62 y.o. female presents to office today for follow up of hypertension.   Essential Hypertension She is checking her blood pressure at home. I added toprol xl along with her amlodipine at her last office visit due to her poorly controlled BP. Today she reports home reading Average: mostly 130s/70s. She has seen some readings with systolic low 220U. She did bring her blood pressure monitor with her today for review and her readings due coincide with her report. Will not make any changes today with her blood pressure medication. She endorses diet and medication compliance. BMI 25. She is not exercising daily. Denies chest pain, shortness of breath, palpitations, lightheadedness, dizziness, headaches or BLE edema.  BP Readings from Last 3 Encounters:  10/05/17 (!) 147/74  09/07/17 (!) 150/84  08/24/17 (!) 168/77    Review of Systems  Constitutional: Negative for fever, malaise/fatigue and weight loss.  HENT: Negative.  Negative for nosebleeds.   Eyes: Negative.  Negative for blurred vision, double vision and photophobia.  Respiratory: Negative.  Negative for cough and shortness of breath.   Cardiovascular: Negative.  Negative for chest pain, palpitations and leg swelling.  Gastrointestinal: Negative.  Negative for abdominal pain, constipation,  diarrhea, heartburn, nausea and vomiting.  Musculoskeletal: Negative.  Negative for myalgias.  Skin: Positive for rash (psoriasis).  Neurological: Negative.  Negative for dizziness, focal weakness, seizures and headaches.  Endo/Heme/Allergies: Negative for environmental allergies.  Psychiatric/Behavioral: Negative.  Negative for suicidal ideas.    Past Medical History:  Diagnosis Date  . Hypertension     History reviewed. No pertinent surgical history.  History reviewed. No pertinent family history.  Social History Reviewed with no changes to be made today.   Outpatient Medications Prior to Visit  Medication Sig Dispense Refill  . clobetasol cream (TEMOVATE) 5.42 % Apply 1 application topically 2 (two) times daily. 60 g 1  . metoprolol succinate (TOPROL-XL) 25 MG 24 hr tablet Take 1 tablet (25 mg total) by mouth daily. 30 tablet 1  . amLODipine (NORVASC) 5 MG tablet Take 1 tablet (5 mg total) by mouth daily. 30 tablet 1   No facility-administered medications prior to visit.     Allergies  Allergen Reactions  . Amoxicillin        Objective:    BP (!) 147/74 (BP Location: Left Arm, Patient Position: Sitting, Cuff Size: Normal)   Pulse 77   Temp 98 F (36.7 C) (Oral)   Ht 5\' 7"  (1.702 m)   Wt 164 lb 3.2 oz (74.5 kg)   SpO2 100%   BMI 25.72 kg/m  Wt Readings from Last 3 Encounters:  10/05/17 164 lb 3.2 oz (74.5 kg)  09/07/17 162 lb 9.6 oz (73.8 kg)  08/24/17 160 lb 6.4 oz (72.8 kg)    Physical Exam  Constitutional: She is oriented to person, place,  and time. She appears well-developed and well-nourished. She is cooperative.  HENT:  Head: Normocephalic and atraumatic.  Eyes: EOM are normal.  Neck: Normal range of motion.  Cardiovascular: Normal rate, regular rhythm, normal heart sounds and intact distal pulses. Exam reveals no gallop and no friction rub.  No murmur heard. Pulmonary/Chest: Effort normal and breath sounds normal. No tachypnea. No respiratory  distress. She has no decreased breath sounds. She has no wheezes. She has no rhonchi. She has no rales. She exhibits no tenderness.  Abdominal: Soft. Bowel sounds are normal.  Musculoskeletal: Normal range of motion. She exhibits no edema.  Neurological: She is alert and oriented to person, place, and time. Coordination normal.  Skin: Skin is warm and dry.  Psychiatric: She has a normal mood and affect. Her behavior is normal. Judgment and thought content normal.  Nursing note and vitals reviewed.      Patient has been counseled extensively about nutrition and exercise as well as the importance of adherence with medications and regular follow-up. The patient was given clear instructions to go to ER or return to medical center if symptoms don't improve, worsen or new problems develop. The patient verbalized understanding.   Follow-up: Return in about 3 months (around 01/03/2018) for HTN.   Gildardo Pounds, FNP-BC [facility] and [place], [place]   10/05/2017, 5:46 PM

## 2017-10-25 ENCOUNTER — Encounter: Payer: Self-pay | Admitting: Nurse Practitioner

## 2017-10-26 NOTE — Telephone Encounter (Signed)
Mychart message

## 2017-10-31 ENCOUNTER — Ambulatory Visit: Payer: Self-pay | Attending: Nurse Practitioner | Admitting: Nurse Practitioner

## 2017-10-31 ENCOUNTER — Other Ambulatory Visit: Payer: Self-pay

## 2017-10-31 ENCOUNTER — Encounter: Payer: Self-pay | Admitting: Nurse Practitioner

## 2017-10-31 VITALS — BP 176/71 | HR 81 | Temp 98.1°F | Ht 67.0 in | Wt 166.4 lb

## 2017-10-31 DIAGNOSIS — R002 Palpitations: Secondary | ICD-10-CM | POA: Insufficient documentation

## 2017-10-31 DIAGNOSIS — I10 Essential (primary) hypertension: Secondary | ICD-10-CM | POA: Insufficient documentation

## 2017-10-31 DIAGNOSIS — Z79899 Other long term (current) drug therapy: Secondary | ICD-10-CM | POA: Insufficient documentation

## 2017-10-31 DIAGNOSIS — Z880 Allergy status to penicillin: Secondary | ICD-10-CM | POA: Insufficient documentation

## 2017-10-31 DIAGNOSIS — I493 Ventricular premature depolarization: Secondary | ICD-10-CM | POA: Insufficient documentation

## 2017-10-31 MED ORDER — BUSPIRONE HCL 7.5 MG PO TABS: 7.5000 mg | ORAL_TABLET | Freq: Three times a day (TID) | ORAL | 0 refills | Status: DC

## 2017-10-31 MED ORDER — METOPROLOL SUCCINATE ER 25 MG PO TB24: 25.0000 mg | ORAL_TABLET | Freq: Every day | ORAL | 1 refills | Status: DC

## 2017-10-31 NOTE — Progress Notes (Signed)
Assessment & Plan:  Paula Ali was seen today for blood pressure check and medication refill.  Diagnoses and all orders for this visit:  Essential hypertension -     metoprolol succinate (TOPROL-XL) 25 MG 24 hr tablet; Take 1 tablet (25 mg total) by mouth daily. Continue all antihypertensives as prescribed.  Remember to bring in your blood pressure log with you for your follow up appointment.  DASH/Mediterranean Diets are healthier choices for HTN.  She was instructed to call the office for elevated BP readings >150/90    PVC (premature ventricular contraction) -     busPIRone (BUSPAR) 7.5 MG tablet; Take 1 tablet (7.5 mg total) by mouth 3 (three) times daily. -     metoprolol succinate (TOPROL-XL) 25 MG 24 hr tablet; Take 1 tablet (25 mg total) by mouth daily. Avoid caffeine. Find ways to reduce stress such as yoga or meditation  Heart palpitations -     EKG 12-Lead We discussed her EKG results. Will add buspar.    Patient has been counseled on age-appropriate routine health concerns for screening and prevention. These are reviewed and up-to-date. Referrals have been placed accordingly. Immunizations are up-to-date or declined.    Subjective:   Chief Complaint  Patient presents with  . Blood Pressure Check    Patient is here for a blood pressure check. Patient did not take her medication this morning, she normally takes it in the evening. Patient is concern with her heart rate mostly when she sits down she can hear it. Patient thinks her heart skip a beat.   . Medication Refill   HPI Paula Ali 62 y.o. female presents to office today for BP recheck.  Essential Hypertension Her blood pressure is elevated today. However she has her blood pressure log with her. BP average at home 140/70-80s. She has a few SBP readings of 150-160 but recalls eating foods that may have contributed to her elevated readings at that time. She is fairly healthy and tends to avoid unhealthy  foods. Denies chest pain, lightheadedness, dizziness, headaches or BLE edema. She endorses heart palpitations today as well as shortness of breath at the gym however she state to me that she just recently started going back to the gym after a hiatus so the shortness of breath may be due to deconditioning. She does deny BLE swelling or edema.  BP Readings from Last 3 Encounters:  10/31/17 (!) 176/71  10/05/17 (!) 147/74  09/07/17 (!) 150/84   Palpitations Patient complains of palpitations.  The symptoms are of moderate severity, occuring at rest and lasting a few seconds per episode. Cardiac risk factors include: hypertension. Aggravating factors: stress/anxiety. Relieving factors: spontaneous. Associated signs and symptoms: none     Review of Systems  Constitutional: Negative for fever, malaise/fatigue and weight loss.  HENT: Negative.  Negative for nosebleeds.   Eyes: Negative.  Negative for blurred vision, double vision and photophobia.  Respiratory: Negative.  Negative for cough and shortness of breath.   Cardiovascular: Positive for palpitations. Negative for chest pain and leg swelling.  Gastrointestinal: Negative.  Negative for abdominal pain, constipation, diarrhea, heartburn, nausea and vomiting.  Neurological: Negative.  Negative for dizziness, focal weakness, seizures and headaches.  Psychiatric/Behavioral: Negative.  Negative for suicidal ideas.    Past Medical History:  Diagnosis Date  . Hypertension     History reviewed. No pertinent surgical history.  History reviewed. No pertinent family history.  Social History Reviewed with no changes to be made today.  Outpatient Medications Prior to Visit  Medication Sig Dispense Refill  . amLODipine (NORVASC) 5 MG tablet Take 1 tablet (5 mg total) by mouth daily. 90 tablet 1  . clobetasol cream (TEMOVATE) 7.78 % Apply 1 application topically 2 (two) times daily. (Patient not taking: Reported on 10/31/2017) 60 g 1  .  metoprolol succinate (TOPROL-XL) 25 MG 24 hr tablet Take 1 tablet (25 mg total) by mouth daily. 30 tablet 1   No facility-administered medications prior to visit.     Allergies  Allergen Reactions  . Amoxicillin        Objective:    BP (!) 176/71 (BP Location: Left Arm, Patient Position: Sitting, Cuff Size: Normal)   Pulse 81   Temp 98.1 F (36.7 C) (Oral)   Ht 5\' 7"  (1.702 m)   Wt 166 lb 6.4 oz (75.5 kg)   SpO2 99%   BMI 26.06 kg/m  Wt Readings from Last 3 Encounters:  10/31/17 166 lb 6.4 oz (75.5 kg)  10/05/17 164 lb 3.2 oz (74.5 kg)  09/07/17 162 lb 9.6 oz (73.8 kg)    Physical Exam  Constitutional: She is oriented to person, place, and time. She appears well-developed and well-nourished. She is cooperative.  HENT:  Head: Normocephalic and atraumatic.  Eyes: EOM are normal.  Neck: Normal range of motion.  Cardiovascular: Normal rate, regular rhythm, normal heart sounds and intact distal pulses. Exam reveals no gallop and no friction rub.  No murmur heard. Pulmonary/Chest: Effort normal and breath sounds normal. No tachypnea. No respiratory distress. She has no decreased breath sounds. She has no wheezes. She has no rhonchi. She has no rales. She exhibits no tenderness.  Abdominal: Soft. Bowel sounds are normal.  Musculoskeletal: Normal range of motion. She exhibits no edema.  Neurological: She is alert and oriented to person, place, and time. Coordination normal.  Skin: Skin is warm and dry.  Psychiatric: She has a normal mood and affect. Her behavior is normal. Judgment and thought content normal.  Nursing note and vitals reviewed.     Patient has been counseled extensively about nutrition and exercise as well as the importance of adherence with medications and regular follow-up. The patient was given clear instructions to go to ER or return to medical center if symptoms don't improve, worsen or new problems develop. The patient verbalized understanding.    Follow-up: Return in about 4 weeks (around 11/28/2017).   Gildardo Pounds, FNP-BC [facility] and [facility], [place]   11/03/2017, 10:28 PM

## 2017-10-31 NOTE — Patient Instructions (Signed)
Premature Ventricular Contraction A premature ventricular contraction (PVC) is a common irregularity in the normal heart rhythm. These contractions are extra heartbeats that start in the heart ventricles and occur too early in the normal sequence. During the PVC, the heart's normal electrical pathway is not used, so the beat is shorter and less effective. In most cases, these contractions come and go and do not require treatment. What are the causes? In many cases, the cause may not be known. Common causes of the condition include:  Smoking.  Drinking alcohol.  Caffeine.  Certain medicines.  Some illegal drugs.  Stress.  Certain medical conditions can also cause PVCs:  Changes in minerals in the blood (electrolytes).  Heart failure.  Heart valve problems.  Low blood oxygen levels or high carbon dioxide levels.  Heart attack, or coronary artery disease.  What are the signs or symptoms? The main symptom of this condition is a fast or skipped heartbeat (palpitations). Other symptoms include:  Chest pain.  Shortness of breath.  Feeling tired.  Dizziness.  In some cases, there are no symptoms. How is this diagnosed? This condition may be diagnosed based on:  Your medical history.  A physical exam. During the exam, the health care provider will check for irregular heartbeats.  Tests, such as: ? An ECG (electrocardiogram) to monitor the electrical activity of your heart. ? Holter monitor testing. This involves wearing a device that clips to your clothing and monitors the electrical activity of your heart over longer periods of time. ? Stress tests to see how exercise affects your heart rhythm and blood supply. ? Echocardiogram. This test uses sound waves (ultrasound) to produce an image of your heart. ? Electrophysiology study. This test checks the electric pathways in your heart.  How is this treated? Treatment depends on any underlying conditions, the type of PVCs  that you are having, and how much the symptoms are interfering with your daily life. Possible treatments include:  Avoiding things that can trigger the premature contractions, such as caffeine or alcohol.  Medicines. These may be given if symptoms are severe or if the extra heartbeats are frequent.  Treatment for any underlying condition that is found to be the cause of the contractions.  Catheter ablation. This procedure destroys the heart tissues that send abnormal signals.  In some cases, no treatment is required. Follow these instructions at home: Lifestyle Follow these instructions as told by your health care provider:  Do not use any products that contain nicotine or tobacco, such as cigarettes and e-cigarettes. If you need help quitting, ask your health care provider.  If caffeine triggers episodes of PVC, do not eat, drink, or use anything with caffeine in it.  If caffeine does not seem to trigger episodes, consume caffeine in moderation.  If alcohol triggers episodes of PVC, do not drink alcohol.  If alcohol does not seem to trigger episodes, limit alcohol intake to no more than 1 drink a day for nonpregnant women and 2 drinks a day for men. One drink equals 12 oz of beer, 5 oz of wine, or 1 oz of hard liquor.  Exercise regularly. Ask your health care provider what type of exercise is safe for you.  Find healthy ways to manage stress. Avoid stressful situations when possible.  Try to get at least 7-9 hours of sleep each night, or as much as recommended by your health care provider.  Do not use illegal drugs.  General instructions  Take over-the-counter and prescription medicines only   as told by your health care provider.  Keep all follow-up visits as told by your health care provider. This is important. Get help right away if:  You feel palpitations that are frequent or continual.  You have chest pain.  You have shortness of breath.  You have sweating for no  reason.  You have nausea and vomiting.  You become light-headed or you faint. This information is not intended to replace advice given to you by your health care provider. Make sure you discuss any questions you have with your health care provider. Document Released: 04/21/2004 Document Revised: 04/28/2016 Document Reviewed: 02/09/2016 Elsevier Interactive Patient Education  2018 [employer]vier Inc.  

## 2017-11-03 ENCOUNTER — Encounter: Payer: Self-pay | Admitting: Nurse Practitioner

## 2017-11-19 NOTE — Telephone Encounter (Signed)
Patient my chart repsonse

## 2018-01-07 ENCOUNTER — Ambulatory Visit: Payer: Self-pay | Admitting: Nurse Practitioner

## 2018-02-22 ENCOUNTER — Encounter: Payer: Self-pay | Admitting: Nurse Practitioner

## 2018-02-22 ENCOUNTER — Ambulatory Visit: Payer: Self-pay | Attending: Nurse Practitioner | Admitting: Nurse Practitioner

## 2018-02-22 VITALS — BP 178/80 | HR 71 | Temp 98.3°F | Resp 18 | Ht 67.0 in | Wt 167.0 lb

## 2018-02-22 DIAGNOSIS — Z79899 Other long term (current) drug therapy: Secondary | ICD-10-CM | POA: Insufficient documentation

## 2018-02-22 DIAGNOSIS — Z09 Encounter for follow-up examination after completed treatment for conditions other than malignant neoplasm: Secondary | ICD-10-CM | POA: Insufficient documentation

## 2018-02-22 DIAGNOSIS — I10 Essential (primary) hypertension: Secondary | ICD-10-CM | POA: Insufficient documentation

## 2018-02-22 DIAGNOSIS — Z880 Allergy status to penicillin: Secondary | ICD-10-CM | POA: Insufficient documentation

## 2018-02-22 MED ORDER — AMLODIPINE BESYLATE 10 MG PO TABS: 10.0000 mg | ORAL_TABLET | Freq: Every day | ORAL | 1 refills | Status: DC

## 2018-02-22 NOTE — Progress Notes (Signed)
Assessment & Plan:  Paula Ali was seen today for follow-up.  Diagnoses and all orders for this visit:  Essential hypertension -     amLODipine (NORVASC) 10 MG tablet; Take 1 tablet (10 mg total) by mouth daily. -     CMP14+EGFR Continue all antihypertensives as prescribed.  Remember to bring in your blood pressure log with you for your follow up appointment.  DASH/Mediterranean Diets are healthier choices for HTN.    Patient has been counseled on age-appropriate routine health concerns for screening and prevention. These are reviewed and up-to-date. Referrals have been placed accordingly. Immunizations are up-to-date or declined.    Subjective:   Chief Complaint  Patient presents with  . Follow-up   HPI Paula Ali 62 y.o. female presents to office today for follow up to HTN.    Essential Hypertension Chronic. Poorly controlled. She is exercising on the elliptical, strength training, and walking. Weight is stable. She is a non smoker. Fairly healthy overall. Lipids are mildly elevated. She is taking omega 3 daily. She endorses medication compliance taking amlodipine 36m and metoprolol 281mdaily. She does endorse increased fatigue and not feeling herself with taking metoprolol and amlodipine.  I have suggested she take the metoprolol at night and amlodipine during the day. She may have "white coat syndrome" as blood pressures at home although abnormal or not as high as office visits. I have asked her to bring in her monitor next office visit and we will compare with manual reading. I have also encouraged her to take her prn buspar prior to her office visits as it seems she is slightly anxious at the onset of her visits. Denies chest pain, shortness of breath, decreased episodes of palpitations, lightheadedness, dizziness, headaches or BLE edema.   Will increase amlodipine today from 77m67mo 53m58mBP Readings from Last 3 Encounters:  02/22/18 (!) 178/80  10/31/17 (!) 176/71    10/05/17 (!) 147/74   Review of Systems  Constitutional: Negative for fever, malaise/fatigue and weight loss.  HENT: Negative.  Negative for nosebleeds.   Eyes: Negative.  Negative for blurred vision, double vision and photophobia.  Respiratory: Negative.  Negative for cough and shortness of breath.   Cardiovascular: Positive for palpitations (improved). Negative for chest pain and leg swelling.  Gastrointestinal: Negative.  Negative for heartburn, nausea and vomiting.  Musculoskeletal: Negative.  Negative for myalgias.  Neurological: Negative.  Negative for dizziness, focal weakness, seizures and headaches.  Psychiatric/Behavioral: Negative.  Negative for suicidal ideas.    Past Medical History:  Diagnosis Date  . Hypertension     History reviewed. No pertinent surgical history.  History reviewed. No pertinent family history.  Social History Reviewed with no changes to be made today.   Outpatient Medications Prior to Visit  Medication Sig Dispense Refill  . busPIRone (BUSPAR) 7.5 MG tablet Take 1 tablet (7.5 mg total) by mouth 3 (three) times daily. 30 tablet 0  . clobetasol cream (TEMOVATE) 0.058.18pply 1 application topically 2 (two) times daily. 60 g 1  . metoprolol succinate (TOPROL-XL) 25 MG 24 hr tablet Take 1 tablet (25 mg total) by mouth daily. 90 tablet 1  . amLODipine (NORVASC) 5 MG tablet Take 1 tablet (5 mg total) by mouth daily. 90 tablet 1   No facility-administered medications prior to visit.     Allergies  Allergen Reactions  . Amoxicillin        Objective:    BP (!) 178/80 (BP Location: Left Arm, Patient  Position: Sitting, Cuff Size: Normal)   Pulse 71   Temp 98.3 F (36.8 C) (Oral)   Resp 18   Ht '5\' 7"'  (1.702 m)   Wt 167 lb (75.8 kg)   SpO2 98%   BMI 26.16 kg/m  Wt Readings from Last 3 Encounters:  02/22/18 167 lb (75.8 kg)  10/31/17 166 lb 6.4 oz (75.5 kg)  10/05/17 164 lb 3.2 oz (74.5 kg)    Physical Exam  Constitutional: She is  oriented to person, place, and time. She appears well-developed and well-nourished. She is cooperative.  HENT:  Head: Normocephalic and atraumatic.  Eyes: EOM are normal.  Neck: Normal range of motion.  Cardiovascular: Normal rate, regular rhythm and normal heart sounds. Exam reveals no gallop and no friction rub.  No murmur heard. Pulmonary/Chest: Effort normal and breath sounds normal. No tachypnea. No respiratory distress. She has no decreased breath sounds. She has no wheezes. She has no rhonchi. She has no rales. She exhibits no tenderness.  Abdominal: Soft. Bowel sounds are normal.  Musculoskeletal: Normal range of motion. She exhibits no edema.  Neurological: She is alert and oriented to person, place, and time. Coordination normal.  Skin: Skin is warm and dry.  Psychiatric: She has a normal mood and affect. Her behavior is normal. Judgment and thought content normal.  Nursing note and vitals reviewed.      Patient has been counseled extensively about nutrition and exercise as well as the importance of adherence with medications and regular follow-up. The patient was given clear instructions to go to ER or return to medical center if symptoms don't improve, worsen or new problems develop. The patient verbalized understanding.   Follow-up: Return in about 3 weeks (around 03/15/2018) for BP recheck; please check BP manually and compare with patient's BP monitor.Gildardo Pounds, FNP-BC [facility] and [facility] [place], [place]   02/22/2018, 1:24 PM

## 2018-02-22 NOTE — Patient Instructions (Signed)
You can take 2 amlodipine tablets to make 10mg  until you run out then take 1 tablet of 10mg  when you receive your refill from your pharmacy.   Take amlodipine in the morning and metoprolol in the evening.  Bring in your BP log/readings and your BP monitor at your next office visit. We will correlate the readings.

## 2018-02-22 NOTE — Progress Notes (Signed)
Patient takes BP medications at 6:00 pm.

## 2018-02-23 LAB — CMP14+EGFR
ALBUMIN: 4.4 g/dL (ref 3.6–4.8)
ALT: 14 IU/L (ref 0–32)
AST: 14 IU/L (ref 0–40)
Albumin/Globulin Ratio: 2.1 (ref 1.2–2.2)
Alkaline Phosphatase: 92 IU/L (ref 39–117)
BUN/Creatinine Ratio: 16 (ref 12–28)
BUN: 13 mg/dL (ref 8–27)
Bilirubin Total: 0.3 mg/dL (ref 0.0–1.2)
CALCIUM: 9.7 mg/dL (ref 8.7–10.3)
CO2: 22 mmol/L (ref 20–29)
CREATININE: 0.79 mg/dL (ref 0.57–1.00)
Chloride: 103 mmol/L (ref 96–106)
GFR, EST AFRICAN AMERICAN: 92 mL/min/{1.73_m2} (ref >59)
GFR, EST NON AFRICAN AMERICAN: 80 mL/min/{1.73_m2} (ref >59)
GLUCOSE: 84 mg/dL (ref 65–99)
Globulin, Total: 2.1 g/dL (ref 1.5–4.5)
Potassium: 4.7 mmol/L (ref 3.5–5.2)
Sodium: 139 mmol/L (ref 134–144)
TOTAL PROTEIN: 6.5 g/dL (ref 6.0–8.5)

## 2018-03-25 ENCOUNTER — Encounter: Payer: Self-pay | Admitting: Pharmacist

## 2018-03-25 ENCOUNTER — Ambulatory Visit: Payer: Self-pay | Attending: Family Medicine | Admitting: Pharmacist

## 2018-03-25 VITALS — BP 168/69

## 2018-03-25 DIAGNOSIS — Z79899 Other long term (current) drug therapy: Secondary | ICD-10-CM | POA: Insufficient documentation

## 2018-03-25 DIAGNOSIS — I10 Essential (primary) hypertension: Secondary | ICD-10-CM | POA: Insufficient documentation

## 2018-03-25 MED ORDER — LISINOPRIL 5 MG PO TABS: 5.0000 mg | ORAL_TABLET | Freq: Every day | ORAL | 0 refills | Status: DC

## 2018-03-25 NOTE — Patient Instructions (Signed)
Thank you for coming to see me today. Please do the following:  1. Stop taking amlodipine.  2. Start lisinopril 5 mg. I have sent this prescription to [facility]. I will see you in 1 week to adjust the dose if needed.   3. Monitor for any symptoms or concerns that you think would be related to this new medication.  4. Keep taking your metoprolol.  5. Monitor home pressures, keep eating a heart-healthy diet, and get exercise as you are able.

## 2018-03-25 NOTE — Progress Notes (Signed)
   S:   PCP: Geryl Rankins   Patient arrives in good spirits but is anxious. States that clinic visits and clinics in general make her this way. She states that she has noticed pressures being better at home. She brings in meter and BP home log today. Denies chest pain, palpitations, dizziness, or HA. Patient was referred by Zelda on 02/22/18.  Patient was last seen by Primary Care Provider on that day.   Patient reports adherence with medications.  Current BP Medications include:   - amlodipine 10 mg daily (increased at 02/22/18 office visit from 5) - metoprolol XL 25 mg at bedtime  Antihypertensives tried in the past include:  - losartan-HCTZ. Of note, states that HCTZ depleted sodium levels. Confirmed this via chart.  Dietary habits include: patient states that she knows to limit sodium but doesn't do this all this time. Reports eating out occasionally. States that she drinks coffee. Mixes caffeine with decaf. Exercise habits include: states going to gym 3 days of the week. Doesn't specify the amount of time spent there. Family / Social history: never smoker, does not drink alcohol  Calculated ASCVD risk using SBP average at home: ~6.7%.  Home BP readings: brings in home BP log SBP range: 122 - 146. Most readings in the high 120s to high 130s. DBP range: 68-82  O:  Last 3 Office BP readings: BP Readings from Last 3 Encounters:  02/22/18 (!) 178/80  10/31/17 (!) 176/71  10/05/17 (!) 147/74    BMET    Component Value Date/Time   NA 139 02/22/2018 1103   K 4.7 02/22/2018 1103   CL 103 02/22/2018 1103   CO2 22 02/22/2018 1103   GLUCOSE 84 02/22/2018 1103   BUN 13 02/22/2018 1103   CREATININE 0.79 02/22/2018 1103   CALCIUM 9.7 02/22/2018 1103   GFRNONAA 80 02/22/2018 1103   GFRAA 92 02/22/2018 1103    Renal function: CrCl cannot be calculated (Patient's most recent lab result is older than the maximum 21 days allowed.).  A/P: Hypertension longstanding currently  uncontrolled on medications. BP Goal = <130/80 mmHg. Patient is adherent with current medications. Endorses ankle edema. Was able to visualize. Believe this to be due to the amlodipine. Patient states that she did not have this problem with the 5 mg dose. Will discontinue amlodipine and commence 5 mg daily of lisinopril. Discussed with patient the possibility of BP increasing at home with med change until we find her appropriate dose of lisinopril. She knows to call me with any issues or concerns.    - D/c amlodipine. Will reassess next week at follow-up appointment for improvement of edema.  - Started lisinopril 5 mg daily. -F/u labs: none (just ordered BMET 02/22/18) - will follow electrolytes (K) and renal function.  -Counseled on lifestyle modifications for blood pressure control including reduced dietary sodium, increased exercise, adequate sleep  Results reviewed and written information provided.   Total time in face-to-face counseling 15 minutes.   F/U PharmD visit in 04/01/18.   Benard Halsted, PharmD, [place] [phone]

## 2018-04-01 ENCOUNTER — Ambulatory Visit: Payer: Self-pay | Attending: Nurse Practitioner | Admitting: Pharmacist

## 2018-04-01 ENCOUNTER — Encounter: Payer: Self-pay | Admitting: Pharmacist

## 2018-04-01 VITALS — BP 175/76 | HR 75

## 2018-04-01 DIAGNOSIS — I10 Essential (primary) hypertension: Secondary | ICD-10-CM | POA: Insufficient documentation

## 2018-04-01 DIAGNOSIS — Z79899 Other long term (current) drug therapy: Secondary | ICD-10-CM | POA: Insufficient documentation

## 2018-04-01 DIAGNOSIS — Z09 Encounter for follow-up examination after completed treatment for conditions other than malignant neoplasm: Secondary | ICD-10-CM | POA: Insufficient documentation

## 2018-04-01 MED ORDER — AMLODIPINE BESYLATE 5 MG PO TABS: 5.0000 mg | ORAL_TABLET | Freq: Every day | ORAL | 2 refills | Status: DC

## 2018-04-01 MED ORDER — METOPROLOL SUCCINATE ER 25 MG PO TB24: 25.0000 mg | ORAL_TABLET | Freq: Every day | ORAL | 0 refills | Status: DC

## 2018-04-01 NOTE — Patient Instructions (Addendum)
Thank you for coming to see [place] today. Please do the following:   1. Please re-start amlodipine 5 mg daily. Take this in the evening.   2. We are stopping lisinopril today.   3. Continue metoprolol XL 25 mg daily.   4. Schedule an appointment with Zelda in clinic as soon as possible when you check out today.

## 2018-04-01 NOTE — Progress Notes (Signed)
S:   PCP: Geryl Rankins  Patient arrives in good spirits but is anxious. States that clinic visits and clinics in general make her this way. She states that she has noticed pressures being higher at home since lisinopril substitution. She brings in BP home log today. Denies chest pain, palpitations, dizziness, or HA. Patient was referred by Zelda on 02/22/18.  Patient was last seen by Primary Care Provider on that day.   Patient reports adherence with medications. Endorses lower lip swelling with lisinopril occurring 03/31/18.  Current BP Medications include:   - lisinopril 5 mg daily - metoprolol XL 25 mg at bedtime  Antihypertensives tried in the past include:  - losartan-HCTZ. Of note, states that HCTZ depleted sodium levels. Confirmed this via chart. - amlodipine 10 mg   Dietary habits include: patient states that she knows to limit sodium but doesn't do this all this time. She reports not adding salt to foods. States that she drinks at least 1 cup of coffee, sometimes 2 cups daily. Exercise habits include: no changes. Family / Social history: never smoker, does not drink alcohol  Calculated ASCVD risk using SBP average at home: ~6.7%.  Home BP readings: brings in home BP log SBP range: 122 - 160  DBP range: 66 - 77  O:  BP: 175/76  HR: 75   Last 3 Office BP readings: BP Readings from Last 3 Encounters:  03/25/18 (!) 168/69  02/22/18 (!) 178/80  10/31/17 (!) 176/71    BMET    Component Value Date/Time   NA 139 02/22/2018 1103   K 4.7 02/22/2018 1103   CL 103 02/22/2018 1103   CO2 22 02/22/2018 1103   GLUCOSE 84 02/22/2018 1103   BUN 13 02/22/2018 1103   CREATININE 0.79 02/22/2018 1103   CALCIUM 9.7 02/22/2018 1103   GFRNONAA 80 02/22/2018 1103   GFRAA 92 02/22/2018 1103    Renal function: CrCl cannot be calculated (Patient's most recent lab result is older than the maximum 21 days allowed.).  A/P: Hypertension longstanding currently uncontrolled on  medications. BP Goal = <130/80 mmHg. Patient is adherent with current medications. Of note, patient reports swelling of bottom lip 03/31/18. Reports this is first occurrence of swelling since starting lisinopril 6 days ago. Denies current swelling, numbness, or tingling of the lip. Self discontinued lisinopril yesterday and reports improvement of symptoms. I cannot currently view any signs of swelling, erythema, or hives. Patient denies any shortness of breath. At this time, will discontinue lisinopril due to possible angioedema and have patient follow-up with PCP.   Most home pressures above goal. Home pressures have been better controlled with amlodipine 10 mg. However, patient developed BLE edema on 10 mg dose. After discussing with patient, she reveals that the BLE edema was not as severe and was tolerable with 5 mg dose. Will re-try 5 mg amlodipine daily. I recommend to not further increase.  Discussed plan with patient thoroughly. Emphasized continued lifestyle management. Patient is amenable to doing this and appears motivated. She knows to call me with any issues or concerns.    - Retrial amlodipine 5 mg daily.  - Discontinued lisinopril 5 mg daily. - F/u labs: none (just ordered BMET 02/22/18)  - Counseled on lifestyle modifications: change caffeine/decaf mix coffee to decaf only, emphasized 150 min/week of aerobic exercise, highlighted importance of checking nutrition labels for sodium content and decreasing salt intake.  Results reviewed and written information provided. Total time in face-to-face counseling 30 minutes.   F/U  clinic visit in 05/15/18.   Ricardo Jericho, PharmD Candidate [employer] 2020  Benard Halsted, PharmD, [place] [phone]

## 2018-05-15 ENCOUNTER — Encounter: Payer: Self-pay | Admitting: Nurse Practitioner

## 2018-05-15 ENCOUNTER — Ambulatory Visit: Payer: Self-pay | Attending: Nurse Practitioner | Admitting: Nurse Practitioner

## 2018-05-15 VITALS — BP 177/85 | HR 77 | Temp 98.2°F | Ht 67.0 in | Wt 163.6 lb

## 2018-05-15 DIAGNOSIS — Z79899 Other long term (current) drug therapy: Secondary | ICD-10-CM | POA: Insufficient documentation

## 2018-05-15 DIAGNOSIS — Z09 Encounter for follow-up examination after completed treatment for conditions other than malignant neoplasm: Secondary | ICD-10-CM | POA: Insufficient documentation

## 2018-05-15 DIAGNOSIS — Z1211 Encounter for screening for malignant neoplasm of colon: Secondary | ICD-10-CM | POA: Insufficient documentation

## 2018-05-15 DIAGNOSIS — Z880 Allergy status to penicillin: Secondary | ICD-10-CM | POA: Insufficient documentation

## 2018-05-15 DIAGNOSIS — Z888 Allergy status to other drugs, medicaments and biological substances status: Secondary | ICD-10-CM | POA: Insufficient documentation

## 2018-05-15 DIAGNOSIS — I10 Essential (primary) hypertension: Secondary | ICD-10-CM | POA: Insufficient documentation

## 2018-05-15 MED ORDER — METOPROLOL SUCCINATE ER 50 MG PO TB24: 50.0000 mg | ORAL_TABLET | Freq: Every day | ORAL | 1 refills | Status: DC

## 2018-05-15 MED ORDER — CLONIDINE HCL 0.1 MG PO TABS
0.2000 mg | ORAL_TABLET | Freq: Once | ORAL | Status: AC
  Administered 2018-05-15: 0.2 mg via ORAL

## 2018-05-15 NOTE — Patient Instructions (Signed)
Managing Your Hypertension Hypertension is commonly called high blood pressure. This is when the force of your blood pressing against the walls of your arteries is too strong. Arteries are blood vessels that carry blood from your heart throughout your body. Hypertension forces the heart to work harder to pump blood, and may cause the arteries to become narrow or stiff. Having untreated or uncontrolled hypertension can cause heart attack, stroke, kidney disease, and other problems. What are blood pressure readings? A blood pressure reading consists of a higher number over a lower number. Ideally, your blood pressure should be below 120/80. The first ("top") number is called the systolic pressure. It is a measure of the pressure in your arteries as your heart beats. The second ("bottom") number is called the diastolic pressure. It is a measure of the pressure in your arteries as the heart relaxes. What does my blood pressure reading mean? Blood pressure is classified into four stages. Based on your blood pressure reading, your health care provider may use the following stages to determine what type of treatment you need, if any. Systolic pressure and diastolic pressure are measured in a unit called mm Hg. Normal  Systolic pressure: below 120.  Diastolic pressure: below 80. Elevated  Systolic pressure: 120-129.  Diastolic pressure: below 80. Hypertension stage 1  Systolic pressure: 130-139.  Diastolic pressure: 80-89. Hypertension stage 2  Systolic pressure: 140 or above.  Diastolic pressure: 90 or above. What health risks are associated with hypertension? Managing your hypertension is an important responsibility. Uncontrolled hypertension can lead to:  A heart attack.  A stroke.  A weakened blood vessel (aneurysm).  Heart failure.  Kidney damage.  Eye damage.  Metabolic syndrome.  Memory and concentration problems.  What changes can I make to manage my  hypertension? Hypertension can be managed by making lifestyle changes and possibly by taking medicines. Your health care provider will help you make a plan to bring your blood pressure within a normal range. Eating and drinking  Eat a diet that is high in fiber and potassium, and low in salt (sodium), added sugar, and fat. An example eating plan is called the DASH (Dietary Approaches to Stop Hypertension) diet. To eat this way: ? Eat plenty of fresh fruits and vegetables. Try to fill half of your plate at each meal with fruits and vegetables. ? Eat whole grains, such as whole wheat pasta, brown rice, or whole grain bread. Fill about one quarter of your plate with whole grains. ? Eat low-fat diary products. ? Avoid fatty cuts of meat, processed or cured meats, and poultry with skin. Fill about one quarter of your plate with lean proteins such as fish, chicken without skin, beans, eggs, and tofu. ? Avoid premade and processed foods. These tend to be higher in sodium, added sugar, and fat.  Reduce your daily sodium intake. Most people with hypertension should eat less than 1,500 mg of sodium a day.  Limit alcohol intake to no more than 1 drink a day for nonpregnant women and 2 drinks a day for men. One drink equals 12 oz of beer, 5 oz of wine, or 1 oz of hard liquor. Lifestyle  Work with your health care provider to maintain a healthy body weight, or to lose weight. Ask what an ideal weight is for you.  Get at least 30 minutes of exercise that causes your heart to beat faster (aerobic exercise) most days of the week. Activities may include walking, swimming, or biking.  Include exercise   to strengthen your muscles (resistance exercise), such as weight lifting, as part of your weekly exercise routine. Try to do these types of exercises for 30 minutes at least 3 days a week.  Do not use any products that contain nicotine or tobacco, such as cigarettes and e-cigarettes. If you need help quitting, ask  your health care provider.  Control any long-term (chronic) conditions you have, such as high cholesterol or diabetes. Monitoring  Monitor your blood pressure at home as told by your health care provider. Your personal target blood pressure may vary depending on your medical conditions, your age, and other factors.  Have your blood pressure checked regularly, as often as told by your health care provider. Working with your health care provider  Review all the medicines you take with your health care provider because there may be side effects or interactions.  Talk with your health care provider about your diet, exercise habits, and other lifestyle factors that may be contributing to hypertension.  Visit your health care provider regularly. Your health care provider can help you create and adjust your plan for managing hypertension. Will I need medicine to control my blood pressure? Your health care provider may prescribe medicine if lifestyle changes are not enough to get your blood pressure under control, and if:  Your systolic blood pressure is 130 or higher.  Your diastolic blood pressure is 80 or higher.  Take medicines only as told by your health care provider. Follow the directions carefully. Blood pressure medicines must be taken as prescribed. The medicine does not work as well when you skip doses. Skipping doses also puts you at risk for problems. Contact a health care provider if:  You think you are having a reaction to medicines you have taken.  You have repeated (recurrent) headaches.  You feel dizzy.  You have swelling in your ankles.  You have trouble with your vision. Get help right away if:  You develop a severe headache or confusion.  You have unusual weakness or numbness, or you feel faint.  You have severe pain in your chest or abdomen.  You vomit repeatedly.  You have trouble breathing. Summary  Hypertension is when the force of blood pumping through  your arteries is too strong. If this condition is not controlled, it may put you at risk for serious complications.  Your personal target blood pressure may vary depending on your medical conditions, your age, and other factors. For most people, a normal blood pressure is less than 120/80.  Hypertension is managed by lifestyle changes, medicines, or both. Lifestyle changes include weight loss, eating a healthy, low-sodium diet, exercising more, and limiting alcohol. This information is not intended to replace advice given to you by your health care provider. Make sure you discuss any questions you have with your health care provider. Document Released: 05/29/2012 Document Revised: 08/02/2016 Document Reviewed: 08/02/2016 Elsevier Interactive Patient Education  2018 Elsevier Inc.  

## 2018-05-15 NOTE — Progress Notes (Signed)
Assessment & Plan:  Paula Ali was seen today for follow-up.  Diagnoses and all orders for this visit:  Essential hypertension -     cloNIDine (CATAPRES) tablet 0.2 mg -     metoprolol succinate (TOPROL-XL) 50 MG 24 hr tablet; Take 1 tablet (50 mg total) by mouth daily.  Colon cancer screening -     Fecal occult blood, imunochemical([facility])    Patient has been counseled on age-appropriate routine health concerns for screening and prevention. These are reviewed and up-to-date. Referrals have been placed accordingly. Immunizations are up-to-date or declined.    Subjective:   Chief Complaint  Patient presents with  . Follow-up    Patient is here to follow-up on blood pressure. Pt. stated he blood pressure runs lower than when she is here in the doctor office.    HPI Paula Ali 62 y.o. female presents to office today to follow up on HTN.    HTN Blood pressure is markedly elevated today despite her endorsement of medication compliance. She required 0.2mg  of clonidine here in the office.  She is currently taking Toprol XL 25 mg daily, Amlodipine 5 mg. I will increase the metoprolol to 50 mg daily.  She has her BP logs with her today with average SBP readings:140-150s. She denies chest pain, shortness of breath, palpitations, lightheadedness, dizziness, headaches or BLE edema.  BP Readings from Last 3 Encounters:  05/15/18 (!) 177/85  04/01/18 (!) 175/76  03/25/18 (!) 168/69    Review of Systems  Constitutional: Negative for fever, malaise/fatigue and weight loss.  HENT: Negative.  Negative for nosebleeds.   Eyes: Negative.  Negative for blurred vision, double vision and photophobia.  Respiratory: Negative.  Negative for cough and shortness of breath.   Cardiovascular: Negative.  Negative for chest pain, palpitations and leg swelling.  Gastrointestinal: Negative.  Negative for heartburn, nausea and vomiting.  Musculoskeletal: Negative.  Negative for myalgias.    Neurological: Negative.  Negative for dizziness, focal weakness, seizures and headaches.  Psychiatric/Behavioral: Negative.  Negative for suicidal ideas.    Past Medical History:  Diagnosis Date  . Hypertension     History reviewed. No pertinent surgical history.  History reviewed. No pertinent family history.  Social History Reviewed with no changes to be made today.   Outpatient Medications Prior to Visit  Medication Sig Dispense Refill  . amLODipine (NORVASC) 5 MG tablet Take 1 tablet (5 mg total) by mouth daily. 30 tablet 2  . metoprolol succinate (TOPROL-XL) 25 MG 24 hr tablet Take 1 tablet (25 mg total) by mouth daily. 30 tablet 0  . busPIRone (BUSPAR) 7.5 MG tablet Take 1 tablet (7.5 mg total) by mouth 3 (three) times daily. (Patient not taking: Reported on 05/15/2018) 30 tablet 0  . clobetasol cream (TEMOVATE) 7.34 % Apply 1 application topically 2 (two) times daily. (Patient not taking: Reported on 05/15/2018) 60 g 1   No facility-administered medications prior to visit.     Allergies  Allergen Reactions  . Lisinopril     Lower lip swelling  . Amoxicillin        Objective:    BP (!) 177/85 (BP Location: Right Arm, Patient Position: Sitting, Cuff Size: Normal)   Pulse 77   Temp 98.2 F (36.8 C) (Oral)   Ht 5\' 7"  (1.702 m)   Wt 163 lb 9.6 oz (74.2 kg)   PF 100 L/min   BMI 25.62 kg/m  Wt Readings from Last 3 Encounters:  05/15/18 163 lb 9.6 oz (74.2  kg)  02/22/18 167 lb (75.8 kg)  10/31/17 166 lb 6.4 oz (75.5 kg)    Physical Exam  Constitutional: She is oriented to person, place, and time. She appears well-developed and well-nourished. She is cooperative.  HENT:  Head: Normocephalic and atraumatic.  Eyes: EOM are normal.  Neck: Normal range of motion.  Cardiovascular: Normal rate, regular rhythm, normal heart sounds and intact distal pulses. Exam reveals no gallop and no friction rub.  No murmur heard. Pulmonary/Chest: Effort normal and breath sounds  normal. No tachypnea. No respiratory distress. She has no decreased breath sounds. She has no wheezes. She has no rhonchi. She has no rales. She exhibits no tenderness.  Abdominal: Soft. Bowel sounds are normal.  Musculoskeletal: Normal range of motion. She exhibits no edema.  Neurological: She is alert and oriented to person, place, and time. Coordination normal.  Skin: Skin is warm and dry.  Psychiatric: She has a normal mood and affect. Her behavior is normal. Judgment and thought content normal.  Nursing note and vitals reviewed.      Patient has been counseled extensively about nutrition and exercise as well as the importance of adherence with medications and regular follow-up. The patient was given clear instructions to go to ER or return to medical center if symptoms don't improve, worsen or new problems develop. The patient verbalized understanding.   Follow-up: Return for 2 week BP recheck with nurse or Lurena Joiner.   Gildardo Pounds, FNP-BC [facility] and [place], [place]   05/21/2018, 10:30 PM

## 2018-05-21 ENCOUNTER — Encounter: Payer: Self-pay | Admitting: Nurse Practitioner

## 2018-05-27 ENCOUNTER — Other Ambulatory Visit: Payer: Self-pay | Admitting: Obstetrics and Gynecology

## 2018-05-27 DIAGNOSIS — Z1231 Encounter for screening mammogram for malignant neoplasm of breast: Secondary | ICD-10-CM

## 2018-05-29 ENCOUNTER — Ambulatory Visit: Payer: Self-pay | Attending: Family Medicine | Admitting: Pharmacist

## 2018-05-29 VITALS — BP 163/73 | HR 72

## 2018-05-29 DIAGNOSIS — I10 Essential (primary) hypertension: Secondary | ICD-10-CM | POA: Insufficient documentation

## 2018-05-29 DIAGNOSIS — Z9889 Other specified postprocedural states: Secondary | ICD-10-CM | POA: Insufficient documentation

## 2018-05-29 DIAGNOSIS — Z87891 Personal history of nicotine dependence: Secondary | ICD-10-CM | POA: Insufficient documentation

## 2018-05-29 NOTE — Progress Notes (Signed)
   S:   PCP: Geryl Rankins  Patient arrives in good spirits. She presents today for blood pressure follow-up. Patient was referred by Zelda on 02/22/18.  She was last seen by Zelda on 05/15/18. Metoprolol dose increased at that visit d/t elevated BP.     Today, she denies chest pain, palpitations, dizziness, or HA. Denies LE edema.  Home BP readings: brings in home BP log SBP range: 130s-140s DBP range: 60s-70s  Current BP Medications include:   - amlodipine 5 mg - metoprolol XL 50 mg at bedtime  Antihypertensives tried in the past include:  - losartan-HCTZ. (DC d/t hyponatremia) . - amlodipine 10 mg (DC d/t lower ankle edema) - lisinopril 5 mg (DC d/t tongue/lip swelling)  Dietary habits include:  -completely stopped caffeine  -limits salt Exercise habits include:  - reports getting more exercise Family / Social history:  - former smoker - does not drink alcohol  O:  BP: 163/73 HR: 72  Last 3 Office BP readings: BP Readings from Last 3 Encounters:  05/15/18 (!) 177/85  04/01/18 (!) 175/76  03/25/18 (!) 168/69   BMET    Component Value Date/Time   NA 139 02/22/2018 1103   K 4.7 02/22/2018 1103   CL 103 02/22/2018 1103   CO2 22 02/22/2018 1103   GLUCOSE 84 02/22/2018 1103   BUN 13 02/22/2018 1103   CREATININE 0.79 02/22/2018 1103   CALCIUM 9.7 02/22/2018 1103   GFRNONAA 80 02/22/2018 1103   GFRAA 92 02/22/2018 1103   Renal function: CrCl cannot be calculated (Patient's most recent lab result is older than the maximum 21 days allowed.).  A/P: Hypertension longstanding currently uncontrolled on medications. BP Goal = <130/80 mmHg. Patient is adherent with current medications.  Most home pressures above goal but improved since last encounter with me. Patient wishes to continue with current medications. If she is not agreeable to retrial of amlodipine 10 mg in the future, would recommend switching metoprolol to carvedilol for enhanced BP lowering. Approximate  equivalent dose of carvedilol to current dose of metoprolol would be 6.25 mg BID.  Discussed plan with patient thoroughly. Emphasized continued lifestyle management. Patient is amenable to doing this and appears motivated. She knows to call me with any issues or concerns.    - Continued anti-hypertensives   - Counseled on lifestyle modifications: change caffeine/decaf mix coffee to decaf only, emphasized 150 min/week of aerobic exercise, highlighted importance of checking nutrition labels for sodium content and decreasing salt intake.  Results reviewed and written information provided. Total time in face-to-face counseling 30 minutes.   F/u with PCP in 2 months.   Marylene Buerger, PharmD Candidate [place] [employer] Class of 2021  Benard Halsted, PharmD, [place] [phone]

## 2018-05-29 NOTE — Patient Instructions (Signed)
Thank you for coming to see us today.   Blood pressure today is improving.  Continue taking blood pressure medications as prescribed.   Limiting salt and caffeine, as well as exercising as able for at least 30 minutes for 5 days out of the week, can also help you lower your blood pressure.  Take your blood pressure at home if you are able. Please write down these numbers and bring them to your visits.  If you have any questions about medications, please call me [phone].  Paula Ali  

## 2018-05-30 ENCOUNTER — Encounter: Payer: Self-pay | Admitting: Pharmacist

## 2018-05-30 LAB — FECAL OCCULT BLOOD, IMMUNOCHEMICAL: FECAL OCCULT BLD: NEGATIVE

## 2018-06-28 ENCOUNTER — Ambulatory Visit: Payer: Self-pay | Admitting: Nurse Practitioner

## 2018-07-01 ENCOUNTER — Telehealth: Payer: Self-pay | Admitting: Nurse Practitioner

## 2018-07-01 DIAGNOSIS — I10 Essential (primary) hypertension: Secondary | ICD-10-CM

## 2018-07-01 MED ORDER — METOPROLOL SUCCINATE ER 50 MG PO TB24: 50.0000 mg | ORAL_TABLET | Freq: Every day | ORAL | 1 refills | Status: DC

## 2018-07-01 MED ORDER — AMLODIPINE BESYLATE 5 MG PO TABS: 5.0000 mg | ORAL_TABLET | Freq: Every day | ORAL | 1 refills | Status: DC

## 2018-07-01 NOTE — Telephone Encounter (Signed)
1) Medication(s) Requested (by name): norvasc toprol-xl 2) Pharmacy of Choice: [facility] on [address] 3) Special Requests:   Approved medications will be sent to the pharmacy, we will reach out if there is an issue.  Requests made after 3pm may not be addressed until the following business day!  If a patient is unsure of the name of the medication(s) please note and ask patient to call back when they are able to provide all info, do not send to responsible party until all information is available!

## 2018-07-04 ENCOUNTER — Other Ambulatory Visit: Payer: Self-pay | Admitting: Pharmacist

## 2018-07-04 DIAGNOSIS — I10 Essential (primary) hypertension: Secondary | ICD-10-CM

## 2018-07-04 MED ORDER — AMLODIPINE BESYLATE 5 MG PO TABS: 5.0000 mg | ORAL_TABLET | Freq: Every day | ORAL | 2 refills | Status: DC

## 2018-07-04 MED ORDER — METOPROLOL SUCCINATE ER 50 MG PO TB24: 50.0000 mg | ORAL_TABLET | Freq: Every day | ORAL | 2 refills | Status: DC

## 2018-07-16 ENCOUNTER — Ambulatory Visit
Admission: RE | Admit: 2018-07-16 | Discharge: 2018-07-16 | Disposition: A | Discharge status: Home / Self Care | Payer: No Typology Code available for payment source | Source: Ambulatory Visit | Attending: Obstetrics and Gynecology | Admitting: Obstetrics and Gynecology

## 2018-07-16 ENCOUNTER — Encounter (HOSPITAL_COMMUNITY): Payer: Self-pay

## 2018-07-16 ENCOUNTER — Ambulatory Visit (HOSPITAL_COMMUNITY)
Admission: RE | Admit: 2018-07-16 | Discharge: 2018-07-16 | Disposition: A | Discharge status: Home / Self Care | Payer: Self-pay | Source: Ambulatory Visit | Attending: Obstetrics and Gynecology | Admitting: Obstetrics and Gynecology

## 2018-07-16 VITALS — BP 170/84 | Ht 67.0 in | Wt 166.0 lb

## 2018-07-16 DIAGNOSIS — Z01419 Encounter for gynecological examination (general) (routine) without abnormal findings: Secondary | ICD-10-CM

## 2018-07-16 DIAGNOSIS — Z1231 Encounter for screening mammogram for malignant neoplasm of breast: Secondary | ICD-10-CM

## 2018-07-16 NOTE — Patient Instructions (Signed)
Explained breast self awareness with Paula Ali. Let patient know [facility] will cover Pap smears and HPV typing every 5 years unless has a history of abnormal Pap smears. Referred patient to the [facility] for a screening mammogram. Appointment scheduled for Tuesday, July 16, 2018 at 1130. Patient aware of appointment and will be there. Let patient know will follow up with her within the next couple weeks with results of Pap smear by letter or phone. Informed patient that [facility] will follow-up with her within the next couple of weeks with results of mammogram by letter or phone. Paula Ali verbalized understanding.  Bettymae Yott, [place], RN 12:03 PM

## 2018-07-16 NOTE — Progress Notes (Signed)
No complaints today.   Pap Smear: Pap smear completed today. Last Pap smear was 06/19/2001 and normal. Per patient has no history of an abnormal Pap smear. Last Pap smear result is in Epic.  Physical exam: Breasts Breasts symmetrical. No skin abnormalities bilateral breasts. Bilateral nipple retraction that per patient is normal for her. No nipple discharge bilateral breasts. No lymphadenopathy. No lumps palpated bilateral breasts. No complaints of pain or tenderness on exam. Referred patient to the [facility] for a screening mammogram. Appointment scheduled for Tuesday, July 16, 2018 at 1130.       Pelvic/Bimanual   Ext Genitalia No lesions, no swelling and no discharge observed on external genitalia.         Vagina Vagina pink and normal texture. No lesions or discharge observed in vagina.          Cervix Cervix is present. Cervix pink and of normal texture. No discharge observed.     Uterus Uterus is present and palpable. Uterus in normal position and normal size.        Adnexae Bilateral ovaries present and palpable. No tenderness on palpation.         Rectovaginal No rectal exam completed today since patient had no rectal complaints. No skin abnormalities observed on exam.    Smoking History: Patient has never smoked.  Patient Navigation: Patient education provided. Access to services provided for patient through [place] program.   Colorectal Cancer Screening: Per patient has never had a colonoscopy completed. Per patient completed a FIT Test in August 2019 that was negative. No complaints today.   Breast and Cervical Cancer Risk Assessment: Patient has no family history of breast cancer, known genetic mutations, or radiation treatment to the chest before age 37. Patient has no history of cervical dysplasia, immunocompromised, or DES exposure in-utero.  Risk Assessment    Risk Scores      07/16/2018   Last edited by: Armond Hang, LPN   5-year  risk: 1.1 %   Lifetime risk: 4.9 %

## 2018-07-17 ENCOUNTER — Other Ambulatory Visit: Payer: Self-pay | Admitting: Obstetrics and Gynecology

## 2018-07-17 DIAGNOSIS — R928 Other abnormal and inconclusive findings on diagnostic imaging of breast: Secondary | ICD-10-CM

## 2018-07-18 LAB — CYTOLOGY - PAP
DIAGNOSIS: NEGATIVE
HPV (WINDOPATH): NOT DETECTED

## 2018-07-19 ENCOUNTER — Ambulatory Visit
Admission: RE | Admit: 2018-07-19 | Discharge: 2018-07-19 | Disposition: A | Discharge status: Home / Self Care | Payer: No Typology Code available for payment source | Source: Ambulatory Visit | Attending: Obstetrics and Gynecology | Admitting: Obstetrics and Gynecology

## 2018-07-19 ENCOUNTER — Ambulatory Visit: Payer: Self-pay | Admitting: Nurse Practitioner

## 2018-07-19 DIAGNOSIS — R928 Other abnormal and inconclusive findings on diagnostic imaging of breast: Secondary | ICD-10-CM

## 2018-07-22 ENCOUNTER — Encounter (HOSPITAL_COMMUNITY): Payer: Self-pay | Admitting: *Deleted

## 2018-08-12 ENCOUNTER — Ambulatory Visit: Payer: Self-pay | Admitting: Nurse Practitioner

## 2018-08-12 ENCOUNTER — Encounter (HOSPITAL_COMMUNITY): Payer: Self-pay | Admitting: *Deleted

## 2018-08-12 NOTE — Progress Notes (Signed)
Letter mailed to patient with negative pap smear results. HPV was negative. Next pap smear due in five years. 

## 2018-08-28 ENCOUNTER — Ambulatory Visit: Payer: Self-pay | Attending: Nurse Practitioner | Admitting: Nurse Practitioner

## 2018-08-28 ENCOUNTER — Encounter: Payer: Self-pay | Admitting: Nurse Practitioner

## 2018-08-28 VITALS — BP 170/75 | HR 79 | Temp 98.2°F | Resp 16 | Wt 167.0 lb

## 2018-08-28 DIAGNOSIS — Z880 Allergy status to penicillin: Secondary | ICD-10-CM | POA: Insufficient documentation

## 2018-08-28 DIAGNOSIS — I10 Essential (primary) hypertension: Secondary | ICD-10-CM | POA: Insufficient documentation

## 2018-08-28 DIAGNOSIS — Z888 Allergy status to other drugs, medicaments and biological substances status: Secondary | ICD-10-CM | POA: Insufficient documentation

## 2018-08-28 DIAGNOSIS — Z79899 Other long term (current) drug therapy: Secondary | ICD-10-CM | POA: Insufficient documentation

## 2018-08-28 DIAGNOSIS — Z23 Encounter for immunization: Secondary | ICD-10-CM | POA: Insufficient documentation

## 2018-08-28 MED ORDER — TETANUS-DIPHTH-ACELL PERTUSSIS 5-2.5-18.5 LF-MCG/0.5 IM SUSP: 0.5000 mL | Freq: Once | INTRAMUSCULAR | 0 refills | Status: AC

## 2018-08-28 MED ORDER — CARVEDILOL 6.25 MG PO TABS: 6.2500 mg | ORAL_TABLET | Freq: Two times a day (BID) | ORAL | 1 refills | Status: DC

## 2018-08-28 NOTE — Progress Notes (Signed)
Diagnoses and all orders for this visit:  Essential hypertension -     carvedilol (COREG) 6.25 MG tablet; Take 1 tablet (6.25 mg total) by mouth 2 (two) times daily with a meal. Continue all antihypertensives as prescribed.  Remember to bring in your blood pressure log with you for your follow up appointment.  DASH/Mediterranean Diets are healthier choices for HTN.   Encounter for administration of vaccine -     Tdap ([place]) 5-2.5-18.5 LF-MCG/0.5 injection; Inject 0.5 mLs into the muscle once for 1 dose.     Patient has been counseled on age-appropriate routine health concerns for screening and prevention. These are reviewed and up-to-date. Referrals have been placed accordingly. Immunizations are up-to-date or declined.    Subjective:   HPI Paula Ali 62 y.o. female presents to office today for follow up to HTN.    Essential Hypertension Chronic and not well controlled. She has a log with her today and average SBP readings are 150-160s. Currently taking amlodipine 5mg  daily. Will add carvedilol 6.25 BID and Dc metoprolol XL 50mg . Medications tried in the past include losartan-hctz (hyponatremia) amlodipine 10 mg (BLE edema) and lisinopril (tongue and lip swelling). She currently denies any chest pain, shortness of breath, palpitations, lightheadedness, dizziness, headaches or BLE edema. Will have her return in a few weeks for BP recheck. She does seem to have "white coat syndrome" as pressures at home are much lower than office readings.  BP Readings from Last 3 Encounters:  08/28/18 (!) 170/75  07/16/18 (!) 170/84  05/29/18 (!) 163/73    Review of Systems  Constitutional: Negative for fever, malaise/fatigue and weight loss.  HENT: Negative.  Negative for nosebleeds.   Eyes: Negative.  Negative for blurred vision, double vision and photophobia.  Respiratory: Negative.  Negative for cough and shortness of breath.   Cardiovascular: Negative.  Negative for chest  pain, palpitations and leg swelling.  Gastrointestinal: Negative.  Negative for heartburn, nausea and vomiting.  Musculoskeletal: Negative.  Negative for myalgias.  Neurological: Negative.  Negative for dizziness, focal weakness, seizures and headaches.  Psychiatric/Behavioral: Negative.  Negative for suicidal ideas.    Past Medical History:  Diagnosis Date  . Hypertension     Past Surgical History:  Procedure Laterality Date  . TONSILLECTOMY      History reviewed. No pertinent family history.  Social History Reviewed with no changes to be made today.   Outpatient Medications Prior to Visit  Medication Sig Dispense Refill  . amLODipine (NORVASC) 5 MG tablet Take 1 tablet (5 mg total) by mouth daily. 30 tablet 2  . metoprolol succinate (TOPROL-XL) 50 MG 24 hr tablet Take 1 tablet (50 mg total) by mouth daily. 30 tablet 2   No facility-administered medications prior to visit.     Allergies  Allergen Reactions  . Lisinopril     Lower lip swelling  . Amoxicillin   . Losartan Potassium     Hyponatremia        Objective:    BP (!) 170/75   Pulse 79   Temp 98.2 F (36.8 C)   Resp 16   Wt 167 lb (75.8 kg)   SpO2 98%   BMI 26.16 kg/m  Wt Readings from Last 3 Encounters:  08/28/18 167 lb (75.8 kg)  07/16/18 166 lb (75.3 kg)  05/15/18 163 lb 9.6 oz (74.2 kg)    Physical Exam Vitals signs and nursing note reviewed.  Constitutional:      Appearance: She is well-developed.  HENT:  Head: Normocephalic and atraumatic.  Neck:     Musculoskeletal: Normal range of motion.  Cardiovascular:     Rate and Rhythm: Normal rate and regular rhythm.     Heart sounds: Normal heart sounds. No murmur. No friction rub. No gallop.   Pulmonary:     Effort: Pulmonary effort is normal. No tachypnea or respiratory distress.     Breath sounds: Normal breath sounds. No decreased breath sounds, wheezing, rhonchi or rales.  Chest:     Chest wall: No tenderness.  Abdominal:      General: Bowel sounds are normal.     Palpations: Abdomen is soft.  Musculoskeletal: Normal range of motion.  Skin:    General: Skin is warm and dry.  Neurological:     Mental Status: She is alert and oriented to person, place, and time.     Coordination: Coordination normal.  Psychiatric:        Behavior: Behavior normal. Behavior is cooperative.        Thought Content: Thought content normal.        Judgment: Judgment normal.        Patient has been counseled extensively about nutrition and exercise as well as the importance of adherence with medications and regular follow-up. The patient was given clear instructions to go to ER or return to medical center if symptoms don't improve, worsen or new problems develop. The patient verbalized understanding.   Follow-up: Return in about 3 weeks (around 09/18/2018) for BP recheck with Lurena Joiner and 3 months with me. Gildardo Pounds, FNP-BC [facility] and [facility] [place], [place]   09/02/2018, 10:27 PM

## 2018-08-28 NOTE — Progress Notes (Signed)
Follow up Med RF 

## 2018-09-19 ENCOUNTER — Ambulatory Visit: Payer: No Typology Code available for payment source | Attending: Family Medicine | Admitting: Pharmacist

## 2018-09-19 VITALS — BP 142/88 | HR 82

## 2018-09-19 DIAGNOSIS — Z79899 Other long term (current) drug therapy: Secondary | ICD-10-CM | POA: Insufficient documentation

## 2018-09-19 DIAGNOSIS — I10 Essential (primary) hypertension: Secondary | ICD-10-CM | POA: Insufficient documentation

## 2018-09-19 MED ORDER — CARVEDILOL 12.5 MG PO TABS: 12.5000 mg | ORAL_TABLET | Freq: Two times a day (BID) | ORAL | 2 refills | Status: DC

## 2018-09-19 MED ORDER — AMLODIPINE BESYLATE 5 MG PO TABS: 5.0000 mg | ORAL_TABLET | Freq: Every day | ORAL | 2 refills | Status: DC

## 2018-09-19 NOTE — Patient Instructions (Addendum)
Thank you for coming to see [place] today.   Blood pressure today is improved.   Start taking 2 tablets twice a day of your carvedilol. Once that bottle is finished, you can pick-up a new prescription for 12.5 mg (1 tablet) twice a day.  Continue amlodipine 5 mg (1 tablet) daily.   Limiting salt and caffeine, as well as exercising as able for at least 30 minutes for 5 days out of the week, can also help you lower your blood pressure.  Take your blood pressure at home if you are able. Please write down these numbers and bring them to your visits.  If you have any questions about medications, please call me [phone].  Lurena Joiner

## 2018-09-19 NOTE — Progress Notes (Signed)
   S:    PCP: Zelda  Patient arrives in good spirits. Presents to the clinic for hypertension management. Patient was referred by Zelda on 08/28/2018. BP at that visit elevated at 170/75. Her metoprolol was changed to carvedilol.   Patient reports adherence with medications. Current BP Medications include:  Amlodipine 5 mg daily, carvedilol 6.25 mg BID  Antihypertensives tried in the past include:  - losartan-HCTZ (DC d/t hyponatremia) - lisinopril (DC d/t lip swelling) - amlodipine 10 mg (DC d/t pedal edema)  Dietary habits include: limits salt, drinks 1/2 caffeine, 1/2 decaff coffee (1 cup in the morning) Exercise habits include: reports not being as active in the past 2 weeks d/t the holiday season Family / Social history: no pertinent FHx, never smoker, denies drinking alcohol  Home BP readings:  - reports seeing 130s/80s the first several days after taking carvedilol - since then reports wide range of SBP; 140s-170s  O:  L arm after 5 minutes rest: 142/88, HR 82  Last 3 Office BP readings: BP Readings from Last 3 Encounters:  09/19/18 (!) 142/88  08/28/18 (!) 170/75  07/16/18 (!) 170/84   BMET    Component Value Date/Time   NA 139 02/22/2018 1103   K 4.7 02/22/2018 1103   CL 103 02/22/2018 1103   CO2 22 02/22/2018 1103   GLUCOSE 84 02/22/2018 1103   BUN 13 02/22/2018 1103   CREATININE 0.79 02/22/2018 1103   CALCIUM 9.7 02/22/2018 1103   GFRNONAA 80 02/22/2018 1103   GFRAA 92 02/22/2018 1103   Renal function: CrCl cannot be calculated (Patient's most recent lab result is older than the maximum 21 days allowed.).  Clinical ASCVD: No  The 10-year ASCVD risk score Mikey Bussing DC Jr., et al., 2013) is: 7.3%   Values used to calculate the score:     Age: 63 years     Sex: Female     Is Non-Hispanic African American: No     Diabetic: No     Tobacco smoker: No     Systolic Blood Pressure: 546 mmHg     Is BP treated: Yes     HDL Cholesterol: 59 mg/dL     Total  Cholesterol: 208 mg/dL  A/P: Hypertension longstanding currently uncontrolled on current medications. BP Goal <130/80 mmHg. Patient is adherent with current medications.  -Increased dose of carvedilol.  -Pt to take two, 6.25 mg tablets BID until finishing her current prescription. In the meantime, I will send in an updated prescription for 12.5 mg BID for her to start once finishing the 6.25 mg bottle.  -Counseled on lifestyle modifications for blood pressure control including reduced dietary sodium, increased exercise, adequate sleep  Results reviewed and written information provided. Total time in face-to-face counseling 15 minutes.   F/U Clinic Visit 12/02/2018.    Benard Halsted, PharmD, [place] [phone]

## 2018-09-20 ENCOUNTER — Encounter: Payer: Self-pay | Admitting: Pharmacist

## 2018-12-02 ENCOUNTER — Ambulatory Visit: Payer: No Typology Code available for payment source | Admitting: Nurse Practitioner

## 2018-12-09 ENCOUNTER — Other Ambulatory Visit: Payer: Self-pay | Admitting: Family Medicine

## 2018-12-09 DIAGNOSIS — I10 Essential (primary) hypertension: Secondary | ICD-10-CM

## 2018-12-20 ENCOUNTER — Telehealth: Payer: Self-pay | Admitting: Nurse Practitioner

## 2018-12-20 NOTE — Telephone Encounter (Signed)
Will route to PCP 

## 2018-12-20 NOTE — Telephone Encounter (Signed)
Pt called in stated that shes been having heart burn since she started taking carvedilol (COREG) 12.5 MG tablet she would like to be put back on the medication she was taking before that please follow up

## 2018-12-23 ENCOUNTER — Other Ambulatory Visit: Payer: Self-pay | Admitting: Nurse Practitioner

## 2018-12-23 MED ORDER — METOPROLOL SUCCINATE ER 50 MG PO TB24: 50.0000 mg | ORAL_TABLET | Freq: Every day | ORAL | 3 refills | Status: DC

## 2019-01-10 ENCOUNTER — Ambulatory Visit: Payer: No Typology Code available for payment source | Admitting: Nurse Practitioner

## 2019-01-17 ENCOUNTER — Other Ambulatory Visit: Payer: Self-pay

## 2019-01-17 ENCOUNTER — Telehealth: Payer: Self-pay | Admitting: Nurse Practitioner

## 2019-01-17 ENCOUNTER — Ambulatory Visit: Payer: Self-pay | Attending: Family Medicine | Admitting: Family Medicine

## 2019-01-17 ENCOUNTER — Encounter: Payer: Self-pay | Admitting: Family Medicine

## 2019-01-17 DIAGNOSIS — R1013 Epigastric pain: Secondary | ICD-10-CM

## 2019-01-17 DIAGNOSIS — K219 Gastro-esophageal reflux disease without esophagitis: Secondary | ICD-10-CM

## 2019-01-17 DIAGNOSIS — R1310 Dysphagia, unspecified: Secondary | ICD-10-CM

## 2019-01-17 MED ORDER — ESOMEPRAZOLE MAGNESIUM 20 MG PO CPDR: 20.0000 mg | DELAYED_RELEASE_CAPSULE | Freq: Two times a day (BID) | ORAL | 1 refills | Status: DC

## 2019-01-17 NOTE — Telephone Encounter (Signed)
Reviewed

## 2019-01-17 NOTE — Telephone Encounter (Signed)
Taken prilosec except for the last two days and eating baked potato and oatmeal to get rid of the heart burn.  Feels like she get something hung in her throat and burning.  She states the heart burn can be so bad at times were she has chest pain from the front of her chest to the back But is not there constantly. Presently has no pain.   Denies greasy foods or acidic juices or foods.  This has been ongoing since 12/20/2018 even after switching from carvedilol and to metoprolol.  Had hiccups as well.   BP readings at home today 147/78 68 today Previous reading 119/58 124/64 124/58 134/64 157/77  Scheduled an appointment for patient today with Dr. Chapman Fitch at 2:30.

## 2019-01-17 NOTE — Progress Notes (Signed)
Patient want's to find out if the medication she is one could be causing her indigestion. Per pt it's any of the blood pressure medications. Per pt the only thing she's been able to eat is potatoes and oatmeal and she do not get heartburn.   Per pt she tried taking the Prilosec and it has not noticed anything but she did not stay on it for 14 days.   Per pt after she ate the oatmeal, she felt like something was stuck in her throat but it's gotten better now.   Per pt she's had diarrhea off and on all day on Monday

## 2019-01-17 NOTE — Telephone Encounter (Signed)
Patient states she has been having heart burn and states she has not been having heart burn because she has only been eating baked potato and oatmeal. Patient states she has been having back pain as well. Patient is not sure if it is a reaction to her BP med. Please follow up.  Advised to go to the ED or UC. Please follow up.

## 2019-01-17 NOTE — Progress Notes (Signed)
Virtual Visit via Telephone Note  I connected with Paula Ali on 01/17/19 at  2:30 PM EDT by telephone and verified that I am speaking with the correct person using two identifiers.   I discussed the limitations, risks, security and privacy concerns of performing an evaluation and management service by telephone and the availability of in person appointments. I also discussed with the patient that there may be a patient responsible charge related to this service. The patient expressed understanding and agreed to proceed.  Patient Location: Home Provider Location: Office Others participating in call: call was initiated by Emilio Aspen, RMA   History of Present Illness:      63 yo female with the complaint of having onset of acid reflux type symptoms which she believes started after she was placed on carvedilol for control of her blood pressure.  Patient states that she discussed this with her primary care provider and was placed back on metoprolol however she continues to have issues with nausea, burning sensation in her mid lower chest and mid upper abdomen, episodes of burning mid upper abdominal pain which seems to radiate through to her back and occasional sensation of a hot, bad tasting liquid washing up into her throat and mouth.  She is also at times felt as if food temporarily gets stuck in her throat when she is attempting to swallow.  She denies any choking on food.  She has noticed that she does have an occasional cough when she is having increased reflux symptoms.  Patient has taken over-the-counter Prilosec for 3 days with no change in her symptoms.  She has noticed that certain foods tend to make her symptoms worse.  She has found that the only thing that she can eat without having heartburn symptoms is oatmeal or baked potatoes.  Patient wonders if her blood pressure medication was the cause of her onset of GI symptoms.  Upon questioning, patient believes that she did have an  EGD many years ago and states that she cannot even recall why she had to have this done but she believes that there was mention of a hiatal hernia at that time.  Patient however states that she did not have any GI symptoms until she was recently placed on carvedilol for her blood pressure.   Past Medical History:  Diagnosis Date  . Hypertension     Past Surgical History:  Procedure Laterality Date  . TONSILLECTOMY      History reviewed. No pertinent family history.  Social History   Tobacco Use  . Smoking status: Never Smoker  . Smokeless tobacco: Never Used  Substance Use Topics  . Alcohol use: No    Frequency: Never  . Drug use: No     Allergies  Allergen Reactions  . Lisinopril     Lower lip swelling  . Amoxicillin   . Losartan Potassium     Hyponatremia     Review of Systems  Constitutional: Negative for chills, fever and malaise/fatigue.  HENT: Negative for congestion and sore throat.   Respiratory: Positive for cough (Occasional, mild and nonproductive). Negative for sputum production and shortness of breath.   Cardiovascular: Positive for chest pain. Negative for palpitations.       Patient has some substernal chest discomfort/burning sensation that occurs along with the pain/discomfort and burning in her mid upper abdomen  Gastrointestinal: Positive for abdominal pain, diarrhea (Occurred this Monday but has resolved), heartburn, nausea and vomiting. Negative for blood in stool and constipation.  Patient with occasional sensation of difficulty swallowing/food sticking in her throat  Genitourinary: Negative for dysuria and frequency.  Musculoskeletal: Positive for back pain (Sometimes has sensation of radiation of pain from the mid upper abdomen to her back). Negative for myalgias.     Observations/Objective: No vital signs or physical exam conducted as visit was done via telephone  Assessment and Plan: 1. Epigastric pain Discussed with the patient that she  needs GI referral for EGD to look for esophagitis , gastritis or ulcer as the cause of her symptoms but she does not wish to be referred to GI at this time. She wishes to try medication and then keep the follow-up that she has with her PCP later this month. RX will be sent in for her to take Nexium 40 mg twice per day but she should follow-up sooner if her symptoms worsen. Continue to avoid known trigger foods and avoid eating within 2 hours of bedtime - esomeprazole (NEXIUM) 20 MG capsule; Take 1 capsule (20 mg total) by mouth 2 (two) times daily before a meal.  Dispense: 60 capsule; Refill: 1  2. Gastroesophageal reflux disease, esophagitis presence not specified Patient with acid reflux symptoms and she reports a remote history of prior EGD with diagnosis of a hiatal hernia. RX for Nexium 20 mg twice per day - esomeprazole (NEXIUM) 20 MG capsule; Take 1 capsule (20 mg total) by mouth 2 (two) times daily before a meal.  Dispense: 60 capsule; Refill: 1  3. Dysphagia, unspecified type Patient declined referral to GI. Start nexium 20 mg twice per day and follow-up with PCP. Chew food well and drink water with meals - esomeprazole (NEXIUM) 20 MG capsule; Take 1 capsule (20 mg total) by mouth 2 (two) times daily before a meal.  Dispense: 60 capsule; Refill: 1  Follow Up Instructions:Return in about 2 weeks (around 01/31/2019) for epigastric pain- keep F/u with PCP [name]).    I discussed the assessment and treatment plan with the patient. The patient was provided an opportunity to ask questions and all were answered. The patient agreed with the plan and demonstrated an understanding of the instructions.   The patient was advised to call back or seek an in-person evaluation if the symptoms worsen or if the condition fails to improve as anticipated.  I provided 15 minutes of non-face-to-face time during this encounter.   Antony Blackbird, MD

## 2019-02-14 ENCOUNTER — Other Ambulatory Visit: Payer: Self-pay

## 2019-02-14 ENCOUNTER — Ambulatory Visit: Payer: Self-pay | Attending: Nurse Practitioner | Admitting: Nurse Practitioner

## 2019-02-14 ENCOUNTER — Encounter: Payer: Self-pay | Admitting: Nurse Practitioner

## 2019-02-14 DIAGNOSIS — K219 Gastro-esophageal reflux disease without esophagitis: Secondary | ICD-10-CM

## 2019-02-14 DIAGNOSIS — I10 Essential (primary) hypertension: Secondary | ICD-10-CM

## 2019-02-14 NOTE — Progress Notes (Signed)
Virtual Visit via Telephone Note Due to national recommendations of social distancing due to [place] 19, telehealth visit is felt to be most appropriate for this patient at this time.  I discussed the limitations, risks, security and privacy concerns of performing an evaluation and management service by telephone and the availability of in person appointments. I also discussed with the patient that there may be a patient responsible charge related to this service. The patient expressed understanding and agreed to proceed.    I connected with Carolyne Littles on 02/14/19  at   2:10 PM EDT  EDT by telephone and verified that I am speaking with the correct person using two identifiers.   Consent I discussed the limitations, risks, security and privacy concerns of performing an evaluation and management service by telephone and the availability of in person appointments. I also discussed with the patient that there may be a patient responsible charge related to this service. The patient expressed understanding and agreed to proceed.   Location of Patient: Private Residence   Location of Provider: [facility] [employer] [facility]    Persons participating in Telemedicine visit: Geryl Rankins FNP-BC [place]    History of Present Illness: Telemedicine visit for: Essential Hypertension   Essential Hypertension She monitors her blood pressure at home and endorses reading averages 130/70s.  Taking amlodipine 5mg  and metoprolol succinate 50 mg daily as prescribed. She was taking hyzaar in the past but taken off due to hyponatremia. She had an allergic reaction to lisinopril with lip swelling. Denies chest pain, shortness of breath, palpitations, lightheadedness, dizziness, headaches or BLE edema.  BP Readings from Last 3 Encounters:  09/19/18 (!) 142/88  08/28/18 (!) 170/75  07/16/18 (!) 170/84    GERD Epigastric pain. Has started taking nexium  which seems to calm her symptoms of heartburn.  INSTRUCTIONS: Avoid GERD Triggers: acidic, spicy or fried foods, caffeine, coffee, sodas,  alcohol and chocolate.   Past Medical History:  Diagnosis Date  . Hypertension     Past Surgical History:  Procedure Laterality Date  . TONSILLECTOMY      History reviewed. No pertinent family history.  Social History   Socioeconomic History  . Marital status: Married    Spouse name: Not on file  . Number of children: Not on file  . Years of education: Not on file  . Highest education level: Not on file  Occupational History  . Not on file  Social Needs  . Financial resource strain: Not on file  . Food insecurity:    Worry: Not on file    Inability: Not on file  . Transportation needs:    Medical: Not on file    Non-medical: Not on file  Tobacco Use  . Smoking status: Never Smoker  . Smokeless tobacco: Never Used  Substance and Sexual Activity  . Alcohol use: No    Frequency: Never  . Drug use: No  . Sexual activity: Not on file  Lifestyle  . Physical activity:    Days per week: Not on file    Minutes per session: Not on file  . Stress: Not on file  Relationships  . Social connections:    Talks on phone: Not on file    Gets together: Not on file    Attends religious service: Not on file    Active member of club or organization: Not on file    Attends meetings of clubs or organizations: Not on file  Relationship status: Not on file  Other Topics Concern  . Not on file  Social History Narrative  . Not on file     Observations/Objective: Awake, alert and oriented x 3   ROS  Assessment and Plan: Caress was seen today for follow-up.  Diagnoses and all orders for this visit:  Essential hypertension -     amLODipine (NORVASC) 5 MG tablet; Take 1 tablet (5 mg total) by mouth daily. Continue all antihypertensives as prescribed.  Remember to bring in your blood pressure log with you for your follow up appointment.   DASH/Mediterranean Diets are healthier choices for HTN.    Gastroesophageal reflux disease, esophagitis presence not specified Continue nexxium as prescribed INSTRUCTIONS: Avoid GERD Triggers: acidic, spicy or fried foods, caffeine, coffee, sodas,  alcohol and chocolate.    Follow Up Instructions Return in about 3 months (around 05/17/2019).     I discussed the assessment and treatment plan with the patient. The patient was provided an opportunity to ask questions and all were answered. The patient agreed with the plan and demonstrated an understanding of the instructions.   The patient was advised to call back or seek an in-person evaluation if the symptoms worsen or if the condition fails to improve as anticipated.  I provided 18 minutes of non-face-to-face time during this encounter including median intraservice time, reviewing previous notes, labs, imaging, medications and explaining diagnosis and management.  Gildardo Pounds, FNP-BC

## 2019-02-20 ENCOUNTER — Encounter: Payer: Self-pay | Admitting: Nurse Practitioner

## 2019-02-20 MED ORDER — AMLODIPINE BESYLATE 5 MG PO TABS: 5.0000 mg | ORAL_TABLET | Freq: Every day | ORAL | 2 refills | Status: DC

## 2019-04-11 ENCOUNTER — Telehealth: Payer: Self-pay

## 2019-04-11 NOTE — Telephone Encounter (Signed)
Pt contacted the office stating she is having some buzzing in her right ear. Pt states when she gets the buzzing in her ear she starts to have dizziness. Pt states sometimes she can get the dizziness without the buzzing.  Please f/u

## 2019-04-13 NOTE — Telephone Encounter (Signed)
Please have her make office appointment.

## 2019-04-16 ENCOUNTER — Other Ambulatory Visit: Payer: Self-pay

## 2019-04-16 ENCOUNTER — Ambulatory Visit: Payer: Self-pay | Attending: Nurse Practitioner | Admitting: Physician Assistant

## 2019-04-16 VITALS — BP 186/84 | HR 81 | Temp 98.5°F | Ht 67.0 in | Wt 170.0 lb

## 2019-04-16 DIAGNOSIS — H9319 Tinnitus, unspecified ear: Secondary | ICD-10-CM | POA: Insufficient documentation

## 2019-04-16 DIAGNOSIS — Z79899 Other long term (current) drug therapy: Secondary | ICD-10-CM | POA: Insufficient documentation

## 2019-04-16 DIAGNOSIS — Z880 Allergy status to penicillin: Secondary | ICD-10-CM | POA: Insufficient documentation

## 2019-04-16 DIAGNOSIS — Z888 Allergy status to other drugs, medicaments and biological substances status: Secondary | ICD-10-CM | POA: Insufficient documentation

## 2019-04-16 DIAGNOSIS — I10 Essential (primary) hypertension: Secondary | ICD-10-CM | POA: Insufficient documentation

## 2019-04-16 MED ORDER — HYDROCHLOROTHIAZIDE 12.5 MG PO TABS: 12.5000 mg | ORAL_TABLET | Freq: Every day | ORAL | 3 refills | Status: DC

## 2019-04-16 NOTE — Progress Notes (Signed)
Patient stated she was having dizziness with ear buzzing.  Pt. Stated when she move her head she gets dizzy, and last weekend she sat down and the room was spinning.   Pt. Did stated it got better.

## 2019-04-16 NOTE — Patient Instructions (Signed)
Check blood pressure daily and record.

## 2019-04-16 NOTE — Progress Notes (Signed)
Patient ID: Paula Ali, female   DOB: 12/19/1955, 63 y.o.   MRN: [id number]    Paula Ali, is a 63 y.o. female  [id number]  [id number]  DOB - 1956-07-04  Subjective:  Chief Complaint and HPI: Paula Ali is a 63 y.o. female here today to for 2 episodes of vertigo/tinnitus in the last month.  Most recent episode occurred 5 days ago.  She denies CP/SOB.  She has stopped taking amlodipine bc she is convinced it is causing her GI upset.  The vertigo/tinnitus episodes were fleeting and lasted only a few mins.  They were worse with position change and lasted 5-10 mins.  She just "didn't feel right" the rest of the day after they occurred.  BP at home has been running 150s/70-80.  ears have felt congested at times.   No FH sudden death, early MI/stroke  ROS:   Constitutional:  No f/c, No night sweats, No unexplained weight loss. EENT:  No vision changes, No blurry vision, No hearing changes. No mouth, throat, or ear problems.  Respiratory: No cough, No SOB Cardiac: No CP, no palpitations GI:  No abd pain, No N/V/D. GU: No Urinary s/sx Musculoskeletal: No joint pain Neuro: No headache, +/- dizziness, no motor weakness.  Skin: No rash Endocrine:  No polydipsia. No polyuria.  Psych: Denies SI/HI  No problems updated.  ALLERGIES: Allergies  Allergen Reactions  . Lisinopril     Lower lip swelling  . Amoxicillin   . Losartan Potassium     Hyponatremia     PAST MEDICAL HISTORY: Past Medical History:  Diagnosis Date  . Hypertension     MEDICATIONS AT HOME: Prior to Admission medications   Medication Sig Start Date End Date Taking? Authorizing Provider  metoprolol succinate (TOPROL-XL) 50 MG 24 hr tablet Take 1 tablet (50 mg total) by mouth daily. Take with or immediately following a meal. 12/23/18  Yes Gildardo Pounds, NP  amLODipine (NORVASC) 5 MG tablet Take 1 tablet (5 mg total) by mouth daily. Patient not taking: Reported on 04/16/2019 02/20/19  05/21/19  Gildardo Pounds, NP  esomeprazole (NEXIUM) 20 MG capsule Take 1 capsule (20 mg total) by mouth 2 (two) times daily before a meal. Patient not taking: Reported on 04/16/2019 01/17/19   Fulp, Cammie, MD  hydrochlorothiazide (HYDRODIURIL) 12.5 MG tablet Take 1 tablet (12.5 mg total) by mouth daily. 04/16/19   Argentina Donovan, PA-C     Objective:  EXAM:   Vitals:   04/16/19 0842 04/16/19 0848  BP: (!) 211/76 (!) 186/84  Pulse: 81   Temp: 98.5 F (36.9 C)   TempSrc: Oral   SpO2: 97%   Weight: 170 lb (77.1 kg)   Height: 5\' 7"  (1.702 m)     General appearance : A&OX3. NAD. Non-toxic-appearing HEENT: Atraumatic and Normocephalic.  PERRLA. EOM intact.  TM full B. Neck: supple, no JVD. No cervical lymphadenopathy. No thyromegaly Chest/Lungs:  Breathing-non-labored, Good air entry bilaterally, breath sounds normal without rales, rhonchi, or wheezing  CVS: S1 S2 regular, no murmurs, gallops, rubs  Extremities: Bilateral Lower Ext shows no edema, both legs are warm to touch with = pulse throughout Neurology:  CN II-XII grossly intact, Non focal.   Psych:  TP linear. J/I WNL. Normal speech. Appropriate eye contact and affect.  Skin:  No Rash  Data Review No results found for: HGBA1C   Assessment & Plan   1. Essential hypertension Uncontrolled-does not want to take amlodipine bc she feels it worsens her  reflux.  EKG ok today. No ST changes.  Check BP daily at home and record and bring to next visit.  Continue current dose metoprolol - hydrochlorothiazide (HYDRODIURIL) 12.5 MG tablet; Take 1 tablet (12.5 mg total) by mouth daily.  Dispense: 90 tablet; Refill: 3 - Comprehensive metabolic panel - CBC with Differential/Platelet - TSH - Vitamin D, 25-hydroxy  2. Tinnitus, unspecified laterality -possibly related to uncontrolled htn.   - TSH - Vitamin D, 25-hydroxy   Patient have been counseled extensively about nutrition and exercise  Return in about 3 weeks (around  05/07/2019) for Zelda for tinnitus and BP f/up.  The patient was given clear instructions to go to ER or return to medical center if symptoms don't improve, worsen or new problems develop. The patient verbalized understanding. The patient was told to call to get lab results if they haven't heard anything in the next week.     Freeman Caldron, PA-C [facility], [place]   04/16/2019, 9:22 AM

## 2019-04-17 LAB — COMPREHENSIVE METABOLIC PANEL
ALT: 14 IU/L (ref 0–32)
AST: 18 IU/L (ref 0–40)
Albumin/Globulin Ratio: 1.9 (ref 1.2–2.2)
Albumin: 4.3 g/dL (ref 3.8–4.8)
Alkaline Phosphatase: 79 IU/L (ref 39–117)
BUN/Creatinine Ratio: 17 (ref 12–28)
BUN: 14 mg/dL (ref 8–27)
Bilirubin Total: 0.3 mg/dL (ref 0.0–1.2)
CO2: 21 mmol/L (ref 20–29)
Calcium: 9.4 mg/dL (ref 8.7–10.3)
Chloride: 101 mmol/L (ref 96–106)
Creatinine, Ser: 0.83 mg/dL (ref 0.57–1.00)
GFR calc Af Amer: 86 mL/min/{1.73_m2} (ref >59)
GFR calc non Af Amer: 75 mL/min/{1.73_m2} (ref >59)
Globulin, Total: 2.3 g/dL (ref 1.5–4.5)
Glucose: 90 mg/dL (ref 65–99)
Potassium: 4.8 mmol/L (ref 3.5–5.2)
Sodium: 138 mmol/L (ref 134–144)
Total Protein: 6.6 g/dL (ref 6.0–8.5)

## 2019-04-17 LAB — CBC WITH DIFFERENTIAL/PLATELET
Basophils Absolute: 0.1 10*3/uL (ref 0.0–0.2)
Basos: 1 %
EOS (ABSOLUTE): 0.2 10*3/uL (ref 0.0–0.4)
Eos: 3 %
Hematocrit: 39.3 % (ref 34.0–46.6)
Hemoglobin: 13.3 g/dL (ref 11.1–15.9)
Immature Grans (Abs): 0 10*3/uL (ref 0.0–0.1)
Immature Granulocytes: 1 %
Lymphocytes Absolute: 1.6 10*3/uL (ref 0.7–3.1)
Lymphs: 31 %
MCH: 31.9 pg (ref 26.6–33.0)
MCHC: 33.8 g/dL (ref 31.5–35.7)
MCV: 94 fL (ref 79–97)
Monocytes Absolute: 0.4 10*3/uL (ref 0.1–0.9)
Monocytes: 7 %
Neutrophils Absolute: 3 10*3/uL (ref 1.4–7.0)
Neutrophils: 57 %
Platelets: 220 10*3/uL (ref 150–450)
RBC: 4.17 x10E6/uL (ref 3.77–5.28)
RDW: 12.1 % (ref 11.7–15.4)
WBC: 5.2 10*3/uL (ref 3.4–10.8)

## 2019-04-17 LAB — VITAMIN D 25 HYDROXY (VIT D DEFICIENCY, FRACTURES): Vit D, 25-Hydroxy: 65.8 ng/mL (ref 30.0–100.0)

## 2019-04-17 LAB — TSH: TSH: 1.94 u[IU]/mL (ref 0.450–4.500)

## 2019-05-09 ENCOUNTER — Other Ambulatory Visit: Payer: Self-pay

## 2019-05-09 ENCOUNTER — Ambulatory Visit: Payer: Self-pay | Attending: Nurse Practitioner | Admitting: Nurse Practitioner

## 2019-05-09 ENCOUNTER — Encounter: Payer: Self-pay | Admitting: Nurse Practitioner

## 2019-05-09 VITALS — BP 176/103 | HR 81 | Temp 99.2°F | Ht 67.0 in | Wt 169.6 lb

## 2019-05-09 DIAGNOSIS — I10 Essential (primary) hypertension: Secondary | ICD-10-CM

## 2019-05-09 DIAGNOSIS — E871 Hypo-osmolality and hyponatremia: Secondary | ICD-10-CM

## 2019-05-09 NOTE — Progress Notes (Signed)
Assessment & Plan:  Paula Ali was seen today for blood pressure check.  Diagnoses and all orders for this visit:  Essential hypertension -     Basic Metabolic Panel Continue all antihypertensives as prescribed.  Remember to bring in your blood pressure log with you for your follow up appointment.  DASH/Mediterranean Diets are healthier choices for HTN.    Hyponatremia -     Basic Metabolic Panel  If blood pressure remains elevated we may need to switch Toprol XL to coreg 25 mg BID. Will need also need to stop HCTZ 12.5mg  and switch to spironolactone or hydralazine if repeat sodium today has decreased as she has a history of hyponatremia.   Patient has been counseled on age-appropriate routine health concerns for screening and prevention. These are reviewed and up-to-date. Referrals have been placed accordingly. Immunizations are up-to-date or declined.    Subjective:   Chief Complaint  Patient presents with  . Blood Pressure Check    Pt. is here for blood pressure check. Pt. stated she still have dizziness and buzz in her right ear. Pt. stated she had put some sweet oils in her right ear and the dizziness stopped.    HPI Paula Ali 63 y.o. female presents to office today for HTN.   ESSENTIAL HYPERTENSION Her blood pressure is very elevated today. She also has complaints of intermittent lightheadness and "buzzing" in her ears R>L. Currently asymptomatic.  Dizziness occurs sometimes while sitting still as well as turning head from side to side.  Last occurrence was 4 days while she was sitting with her grandson at the table. She denies any falls.  She is taking Toprol XL 50 mg daily and most recently HCTZ 12.5mg  daily. We have tried her on several antihypertensives including: Amlodipine: patient stopped taking stating it caused GERD symptom Losartan: Hyponatremia Lisinopril: Lower lip swelling She states her blood pressures at home have not been higher than  "150/70-80 something." I have asked her to return next week with her monitor so we can correlate with our office monitor. I do not believe this is white coat syndrome. She has a history of being very reluctant to taking any antihypertensive in the past.  Denies chest pain, shortness of breath, palpitations, headaches or BLE edema.  BP Readings from Last 3 Encounters:  05/09/19 (!) 176/103  04/16/19 (!) 186/84  09/19/18 (!) 142/88     Review of Systems  Constitutional: Negative for fever, malaise/fatigue and weight loss.  HENT: Positive for tinnitus. Negative for ear discharge, ear pain, hearing loss and nosebleeds.   Eyes: Negative.  Negative for blurred vision, double vision and photophobia.  Respiratory: Negative.  Negative for cough and shortness of breath.   Cardiovascular: Negative.  Negative for chest pain, palpitations and leg swelling.  Gastrointestinal: Negative.  Negative for heartburn, nausea and vomiting.  Musculoskeletal: Negative.  Negative for myalgias.  Neurological: Positive for dizziness. Negative for focal weakness, seizures and headaches.  Psychiatric/Behavioral: Negative.  Negative for suicidal ideas.    Past Medical History:  Diagnosis Date  . Hypertension     Past Surgical History:  Procedure Laterality Date  . TONSILLECTOMY      History reviewed. No pertinent family history.  Social History Reviewed with no changes to be made today.   Outpatient Medications Prior to Visit  Medication Sig Dispense Refill  . hydrochlorothiazide (HYDRODIURIL) 12.5 MG tablet Take 1 tablet (12.5 mg total) by mouth daily. 90 tablet 3  . metoprolol succinate (TOPROL-XL) 50 MG 24  hr tablet Take 1 tablet (50 mg total) by mouth daily. Take with or immediately following a meal. 90 tablet 3  . amLODipine (NORVASC) 5 MG tablet Take 1 tablet (5 mg total) by mouth daily. (Patient not taking: Reported on 04/16/2019) 90 tablet 2  . esomeprazole (NEXIUM) 20 MG capsule Take 1 capsule (20 mg  total) by mouth 2 (two) times daily before a meal. (Patient not taking: Reported on 04/16/2019) 60 capsule 1   No facility-administered medications prior to visit.     Allergies  Allergen Reactions  . Lisinopril     Lower lip swelling  . Amoxicillin   . Losartan Potassium     Hyponatremia        Objective:    BP (!) 176/103 (BP Location: Right Arm, Patient Position: Sitting, Cuff Size: Normal)   Pulse 81   Temp 99.2 F (37.3 C) (Oral)   Ht 5\' 7"  (1.702 m)   Wt 169 lb 9.6 oz (76.9 kg)   SpO2 97%   BMI 26.56 kg/m  Wt Readings from Last 3 Encounters:  05/09/19 169 lb 9.6 oz (76.9 kg)  04/16/19 170 lb (77.1 kg)  08/28/18 167 lb (75.8 kg)    Physical Exam Vitals signs and nursing note reviewed.  Constitutional:      Appearance: She is well-developed.  HENT:     Head: Normocephalic and atraumatic.     Right Ear: Hearing, tympanic membrane, ear canal and external ear normal.     Left Ear: Hearing, tympanic membrane, ear canal and external ear normal.  Neck:     Musculoskeletal: Normal range of motion.  Cardiovascular:     Rate and Rhythm: Normal rate and regular rhythm.     Heart sounds: Normal heart sounds. No murmur. No friction rub. No gallop.   Pulmonary:     Effort: Pulmonary effort is normal. No tachypnea or respiratory distress.     Breath sounds: Normal breath sounds. No decreased breath sounds, wheezing, rhonchi or rales.  Chest:     Chest wall: No tenderness.  Abdominal:     General: Bowel sounds are normal.     Palpations: Abdomen is soft.  Musculoskeletal: Normal range of motion.  Skin:    General: Skin is warm and dry.  Neurological:     Mental Status: She is alert and oriented to person, place, and time.     Cranial Nerves: Cranial nerves are intact.     Sensory: Sensation is intact.     Motor: Motor function is intact.     Coordination: Coordination is intact. Romberg sign negative. Coordination normal.     Gait: Gait is intact.  Psychiatric:         Behavior: Behavior normal. Behavior is cooperative.        Thought Content: Thought content normal.        Judgment: Judgment normal.        Patient has been counseled extensively about nutrition and exercise as well as the importance of adherence with medications and regular follow-up. The patient was given clear instructions to go to ER or return to medical center if symptoms don't improve, worsen or new problems develop. The patient verbalized understanding.   Follow-up: Return in about 1 week (around 05/16/2019) for with luke on a friday. She is to bring BP monitor and log.   Gildardo Pounds, FNP-BC [facility] [place], [place]   05/10/2019, 11:07 PM

## 2019-05-10 ENCOUNTER — Other Ambulatory Visit: Payer: Self-pay | Admitting: Nurse Practitioner

## 2019-05-10 ENCOUNTER — Encounter: Payer: Self-pay | Admitting: Nurse Practitioner

## 2019-05-10 LAB — BASIC METABOLIC PANEL
BUN/Creatinine Ratio: 16 (ref 12–28)
BUN: 12 mg/dL (ref 8–27)
CO2: 24 mmol/L (ref 20–29)
Calcium: 9.8 mg/dL (ref 8.7–10.3)
Chloride: 97 mmol/L (ref 96–106)
Creatinine, Ser: 0.76 mg/dL (ref 0.57–1.00)
GFR calc Af Amer: 96 mL/min/{1.73_m2} (ref >59)
GFR calc non Af Amer: 83 mL/min/{1.73_m2} (ref >59)
Glucose: 89 mg/dL (ref 65–99)
Potassium: 4 mmol/L (ref 3.5–5.2)
Sodium: 134 mmol/L (ref 134–144)

## 2019-05-10 MED ORDER — HYDRALAZINE HCL 10 MG PO TABS: 10.0000 mg | ORAL_TABLET | Freq: Three times a day (TID) | ORAL | 0 refills | Status: DC

## 2019-05-16 ENCOUNTER — Encounter: Payer: Self-pay | Admitting: Pharmacist

## 2019-05-16 ENCOUNTER — Other Ambulatory Visit: Payer: Self-pay

## 2019-05-16 ENCOUNTER — Ambulatory Visit: Payer: Self-pay | Attending: Nurse Practitioner | Admitting: Pharmacist

## 2019-05-16 VITALS — BP 160/82 | HR 88

## 2019-05-16 DIAGNOSIS — I10 Essential (primary) hypertension: Secondary | ICD-10-CM

## 2019-05-16 NOTE — Progress Notes (Signed)
   S:    PCP: Paula Ali  Patient arrives in good spirits. Presents to the clinic for hypertension management. Patient was referred by Paula Ali on 05/09/19. BP at that visit elevated at 176/103. Her HCTZ ws stopped; hydralazine started d/t low sodium.   Patient denies adherence with medications.  Current BP Medications include:   - amlodipine 5 mg (stopped d/t reflux symptoms) -hydralazine 10 mg TID (has not started) -Toprol XL 50 mg daily -HCTZ is not on her list (d/c'd d/t hyponatremia - however, pt has continued to take)   Antihypertensives tried in the past include:  - losartan-HCTZ (DC d/t hyponatremia) - lisinopril (DC d/t lip swelling) - amlodipine 10 mg (DC d/t pedal edema, acid-reflux symptoms) -carvedilol (acid reflux like symptoms)   Dietary habits include: limits salt, drinks 1/2 caffeine, 1/2 decaff coffee (1 cup in the morning) Exercise habits include: reports not being as active in the past 2 weeks d/t the holiday season Family / Social history: no pertinent FHx, never smoker, denies drinking alcohol  Home BP readings:  - brings log - SBP range: 113 - 157 - DBP range: 63 - 81  O:  L arm after 5 minutes rest: 160/82  Last 3 Office BP readings: BP Readings from Last 3 Encounters:  05/16/19 (!) 160/82  05/09/19 (!) 176/103  04/16/19 (!) 186/84   BMET    Component Value Date/Time   NA 134 05/09/2019 1653   K 4.0 05/09/2019 1653   CL 97 05/09/2019 1653   CO2 24 05/09/2019 1653   GLUCOSE 89 05/09/2019 1653   BUN 12 05/09/2019 1653   CREATININE 0.76 05/09/2019 1653   CALCIUM 9.8 05/09/2019 1653   GFRNONAA 83 05/09/2019 1653   GFRAA 96 05/09/2019 1653   Renal function: Estimated Creatinine Clearance: 75.9 mL/min (by C-G formula based on SCr of 0.76 mg/dL).  Clinical ASCVD: No  The 10-year ASCVD risk score Mikey Bussing DC Jr., et al., 2013) is: 10.2%   Values used to calculate the score:     Age: 63 years     Sex: Female     Is Non-Hispanic African American: No  Diabetic: No     Tobacco smoker: No     Systolic Blood Pressure: 0000000 mmHg     Is BP treated: Yes     HDL Cholesterol: 59 mg/dL     Total Cholesterol: 208 mg/dL  A/P: Hypertension longstanding currently uncontrolled on current medications. BP Goal <130/80 mmHg. Patient is adherent with current medications. Reinforced compliance to current regimen. She know to stop HCTZ and start hydralazine. She will continue metoprolol. -Continue metoprolol, hydralazine.  -Stop HCTZ -Counseled on lifestyle modifications for blood pressure control including reduced dietary sodium, increased exercise, adequate sleep  Results reviewed and written information provided. Total time in face-to-face counseling 15 minutes.   F/U Clinic Visit in 2 weeks.    Benard Halsted, PharmD, [place] [phone]

## 2019-05-16 NOTE — Patient Instructions (Signed)
Thank you for coming to see [place] today.   Blood pressure today is above goal.   Please stop HCTZ.  Continue metoprolol once daily.   Start hydralazine 10 mg three times a day.  Limiting salt and caffeine, as well as exercising as able for at least 30 minutes for 5 days out of the week, can also help you lower your blood pressure.  Take your blood pressure at home if you are able. Please write down these numbers and bring them to your visits.  If you have any questions about medications, please call me [phone].  Lurena Joiner

## 2019-05-30 ENCOUNTER — Ambulatory Visit: Payer: No Typology Code available for payment source | Admitting: Pharmacist

## 2019-06-06 ENCOUNTER — Other Ambulatory Visit: Payer: Self-pay

## 2019-06-06 ENCOUNTER — Ambulatory Visit: Payer: Self-pay | Attending: Nurse Practitioner | Admitting: Pharmacist

## 2019-06-06 ENCOUNTER — Encounter: Payer: Self-pay | Admitting: Pharmacist

## 2019-06-06 VITALS — BP 171/67 | HR 80

## 2019-06-06 DIAGNOSIS — I10 Essential (primary) hypertension: Secondary | ICD-10-CM

## 2019-06-06 NOTE — Progress Notes (Signed)
   S:    PCP: Zelda  Patient arrives in good spirits. Presents to the clinic for hypertension management. Patient was referred by Zelda on 05/09/19. I saw her on 05/16/19 and her BP was elevated; she had not started her hydralazine.   Patient reports adherence with medications. Endorses HA at the beginning of hydralazine but has since resolved.   Current BP Medications include:   -hydralazine 10 mg TID  -Toprol XL 50 mg daily  Antihypertensives tried in the past include:  - losartan-HCTZ (DC d/t hyponatremia) - lisinopril (DC d/t lip swelling) - amlodipine 10 mg (DC d/t pedal edema, acid-reflux symptoms) - carvedilol (acid reflux like symptoms)  - HCTZ (hyponatremia)   Dietary habits include: limits salt, drinks 1/2 caffeine, 1/2 decaff coffee (1 cup in the morning) Exercise habits include: reports not being as active  Family / Social history: no pertinent FHx, never smoker, denies drinking alcohol  Home BP readings:  - brings log - BP this AM was 138/81 - SBPs: 128 - 152; most readings in the 130s-140s - DBPs: 63 - 87; most readings in the 60s and 70s  O:  L arm after 5 minutes rest: 171/67  Last 3 Office BP readings: BP Readings from Last 3 Encounters:  06/06/19 (!) 171/67  05/16/19 (!) 160/82  05/09/19 (!) 176/103   BMET    Component Value Date/Time   NA 134 05/09/2019 1653   K 4.0 05/09/2019 1653   CL 97 05/09/2019 1653   CO2 24 05/09/2019 1653   GLUCOSE 89 05/09/2019 1653   BUN 12 05/09/2019 1653   CREATININE 0.76 05/09/2019 1653   CALCIUM 9.8 05/09/2019 1653   GFRNONAA 83 05/09/2019 1653   GFRAA 96 05/09/2019 1653   Renal function: CrCl cannot be calculated (Patient's most recent lab result is older than the maximum 21 days allowed.).  Clinical ASCVD: No  The 10-year ASCVD risk score Mikey Bussing DC Jr., et al., 2013) is: 11.6%   Values used to calculate the score:     Age: 63 years     Sex: Female     Is Non-Hispanic African American: No     Diabetic: No   Tobacco smoker: No     Systolic Blood Pressure: XX123456 mmHg     Is BP treated: Yes     HDL Cholesterol: 59 mg/dL     Total Cholesterol: 208 mg/dL  A/P: Hypertension longstanding currently uncontrolled on current medications. BP Goal <130/80 mmHg. Patient is adherent with current medications. -Continue metoprolol. -Increase hydralazine to 25 mg TID -Counseled on lifestyle modifications for blood pressure control including reduced dietary sodium, increased exercise, adequate sleep  Results reviewed and written information provided. Total time in face-to-face counseling 15 minutes.   F/U Clinic Visit in 3 weeks.    Benard Halsted, PharmD, [place] [phone]

## 2019-06-06 NOTE — Patient Instructions (Signed)
Thank you for coming to see [place] today.   Blood pressure today is elevated.  Increase hydralazine 25 mg TID.  Continue metoprolol.    Limiting salt and caffeine, as well as exercising as able for at least 30 minutes for 5 days out of the week, can also help you lower your blood pressure.  Take your blood pressure at home if you are able. Please write down these numbers and bring them to your visits.  If you have any questions about medications, please call me [phone].  Lurena Joiner

## 2019-06-09 MED ORDER — HYDRALAZINE HCL 25 MG PO TABS: 25.0000 mg | ORAL_TABLET | Freq: Three times a day (TID) | ORAL | 0 refills | Status: DC

## 2019-06-09 NOTE — Addendum Note (Signed)
Addended by: Daisy Blossom, Annie Main L on: 06/09/2019 01:51 PM   Modules accepted: Orders

## 2019-06-27 ENCOUNTER — Encounter: Payer: Self-pay | Admitting: Pharmacist

## 2019-06-27 ENCOUNTER — Other Ambulatory Visit: Payer: Self-pay

## 2019-06-27 ENCOUNTER — Ambulatory Visit: Payer: Self-pay | Attending: Nurse Practitioner | Admitting: Pharmacist

## 2019-06-27 VITALS — BP 175/71

## 2019-06-27 DIAGNOSIS — I10 Essential (primary) hypertension: Secondary | ICD-10-CM

## 2019-06-27 NOTE — Patient Instructions (Signed)
Thank you for coming to see [place] today.   Blood pressure today is remains elevated.  Continue taking blood pressure medications as prescribed.   Limiting salt and caffeine, as well as exercising as able for at least 30 minutes for 5 days out of the week, can also help you lower your blood pressure.  Take your blood pressure at home if you are able. Please write down these numbers and bring them to your visits.  If you have any questions about medications, please call me [phone].  Lurena Joiner

## 2019-06-27 NOTE — Progress Notes (Signed)
   S:    PCP: Zelda  Patient arrives in good spirits. Presents to the clinic for hypertension management. Patient was referred by Zelda on 05/09/19. I saw her on 05/16/19 and her BP was elevated; she had not started her hydralazine.   Patient reports adherence with medications. Endorses HA at the beginning of hydralazine but has since resolved.   Current BP Medications include:   -hydralazine 25 mg TID  -Toprol XL 50 mg daily  Antihypertensives tried in the past include:  - losartan-HCTZ (DC d/t hyponatremia) - lisinopril (DC d/t lip swelling) - amlodipine 10 mg (DC d/t pedal edema, acid-reflux symptoms) - carvedilol (acid reflux like symptoms)  - HCTZ (hyponatremia)   Dietary habits include: limits salt, drinks 1/2 caffeine, 1/2 decaff coffee (1 cup in the morning) Exercise habits include: reports not being as active  Family / Social history: no pertinent FHx, never smoker, denies drinking alcohol  Home BP readings:  - brings log - SBPs: 137 - 172; most readings in the 130s-140s - DBPs: 63 - 87; most readings in the 60s and 70s  O:  L arm after 5 minutes rest: 175/71  Last 3 Office BP readings: BP Readings from Last 3 Encounters:  06/06/19 (!) 171/67  05/16/19 (!) 160/82  05/09/19 (!) 176/103   BMET    Component Value Date/Time   NA 134 05/09/2019 1653   K 4.0 05/09/2019 1653   CL 97 05/09/2019 1653   CO2 24 05/09/2019 1653   GLUCOSE 89 05/09/2019 1653   BUN 12 05/09/2019 1653   CREATININE 0.76 05/09/2019 1653   CALCIUM 9.8 05/09/2019 1653   GFRNONAA 83 05/09/2019 1653   GFRAA 96 05/09/2019 1653   Renal function: CrCl cannot be calculated (Patient's most recent lab result is older than the maximum 21 days allowed.).  Clinical ASCVD: No  The 10-year ASCVD risk score Mikey Bussing DC Jr., et al., 2013) is: 11.6%   Values used to calculate the score:     Age: 17 years     Sex: Female     Is Non-Hispanic African American: No     Diabetic: No     Tobacco smoker: No  Systolic Blood Pressure: XX123456 mmHg     Is BP treated: Yes     HDL Cholesterol: 59 mg/dL     Total Cholesterol: 208 mg/dL  A/P: Hypertension longstanding currently uncontrolled on current medications. BP Goal <130/80 mmHg. Patient is adherent with current medications. Discussed need to make change to therapy but this is difficult given her DBP being at goal. I have instructede her to follow-up with her PCP. I recommend to add spironolactone. Her electrolytes are WNL. She will discuss this with her PCP. -Continue metoprolol. -Continue hydralazine to 25 mg TID -Counseled on lifestyle modifications for blood pressure control including reduced dietary sodium, increased exercise, adequate sleep  Results reviewed and written information provided. Total time in face-to-face counseling 15 minutes.   F/U Clinic Visit next month with PCP.   Benard Halsted, PharmD, [place] [phone]

## 2019-07-18 ENCOUNTER — Other Ambulatory Visit: Payer: Self-pay | Admitting: Family Medicine

## 2019-07-18 DIAGNOSIS — I10 Essential (primary) hypertension: Secondary | ICD-10-CM

## 2019-08-01 ENCOUNTER — Ambulatory Visit: Payer: Self-pay | Attending: Nurse Practitioner | Admitting: Nurse Practitioner

## 2019-08-01 ENCOUNTER — Encounter: Payer: Self-pay | Admitting: Nurse Practitioner

## 2019-08-01 ENCOUNTER — Other Ambulatory Visit: Payer: Self-pay

## 2019-08-01 DIAGNOSIS — I10 Essential (primary) hypertension: Secondary | ICD-10-CM

## 2019-08-01 MED ORDER — HYDRALAZINE HCL 50 MG PO TABS: 50.0000 mg | ORAL_TABLET | Freq: Three times a day (TID) | ORAL | 1 refills | Status: DC

## 2019-08-01 MED ORDER — METOPROLOL SUCCINATE ER 50 MG PO TB24: 50.0000 mg | ORAL_TABLET | Freq: Every day | ORAL | 1 refills | Status: DC

## 2019-08-01 NOTE — Progress Notes (Signed)
Virtual Visit via Telephone Note Due to national recommendations of social distancing due to [place] 19, telehealth visit is felt to be most appropriate for this patient at this time.  I discussed the limitations, risks, security and privacy concerns of performing an evaluation and management service by telephone and the availability of in person appointments. I also discussed with the patient that there may be a patient responsible charge related to this service. The patient expressed understanding and agreed to proceed.    I connected with Carolyne Littles on 08/01/19  at   3:30 PM EST  EDT by telephone and verified that I am speaking with the correct person using two identifiers.   Consent I discussed the limitations, risks, security and privacy concerns of performing an evaluation and management service by telephone and the availability of in person appointments. I also discussed with the patient that there may be a patient responsible charge related to this service. The patient expressed understanding and agreed to proceed.   Location of Patient: Private Residence   Location of Provider: [place] and [place] participating in Telemedicine visit: Geryl Rankins FNP-BC [place]    History of Present Illness: Telemedicine visit for: Essential Hypertension  Checked her blood pressure this morning around 11 am:165/78. Mostly reading high. Currently taking hydralazine 25 mg TID. Will increase to 50 mg TID. She was instructed to continue metoprolol XL 50 mg daily.  Tends to avoid salty foods or adding salt to her diet. Denies chest pain, shortness of breath, palpitations, lightheadedness, dizziness, headaches or BLE edema. States she did have headaches initially after hydralazine was switched from 10 mg to 25 mg however the headaches resolved after 3 days.    Past Medical History:  Diagnosis Date  . Hypertension      Past Surgical History:  Procedure Laterality Date  . TONSILLECTOMY      History reviewed. No pertinent family history.  Social History   Socioeconomic History  . Marital status: Married    Spouse name: Not on file  . Number of children: Not on file  . Years of education: Not on file  . Highest education level: Not on file  Occupational History  . Not on file  Social Needs  . Financial resource strain: Not on file  . Food insecurity    Worry: Not on file    Inability: Not on file  . Transportation needs    Medical: Not on file    Non-medical: Not on file  Tobacco Use  . Smoking status: Never Smoker  . Smokeless tobacco: Never Used  Substance and Sexual Activity  . Alcohol use: No    Frequency: Never  . Drug use: No  . Sexual activity: Not on file  Lifestyle  . Physical activity    Days per week: Not on file    Minutes per session: Not on file  . Stress: Not on file  Relationships  . Social Herbalist on phone: Not on file    Gets together: Not on file    Attends religious service: Not on file    Active member of club or organization: Not on file    Attends meetings of clubs or organizations: Not on file    Relationship status: Not on file  Other Topics Concern  . Not on file  Social History Narrative  . Not on file     Observations/Objective: Awake, alert and oriented  x 3   Review of Systems  Constitutional: Negative for fever, malaise/fatigue and weight loss.  HENT: Negative.  Negative for nosebleeds.   Eyes: Negative.  Negative for blurred vision, double vision and photophobia.  Respiratory: Negative.  Negative for cough and shortness of breath.   Cardiovascular: Negative.  Negative for chest pain, palpitations and leg swelling.  Gastrointestinal: Negative.  Negative for heartburn, nausea and vomiting.  Musculoskeletal: Negative.  Negative for myalgias.  Neurological: Negative.  Negative for dizziness, focal weakness, seizures and headaches.   Psychiatric/Behavioral: Negative.  Negative for suicidal ideas.    Assessment and Plan: Dannan was seen today for follow-up.  Diagnoses and all orders for this visit:  Essential hypertension -     hydrALAZINE (APRESOLINE) 50 MG tablet; Take 1 tablet (50 mg total) by mouth 3 (three) times daily. -     metoprolol succinate (TOPROL-XL) 50 MG 24 hr tablet; Take 1 tablet (50 mg total) by mouth daily. Take with or immediately following a meal.     Follow Up Instructions Return in about 3 weeks (around 08/22/2019) for NURSE VISIT 4pm.     I discussed the assessment and treatment plan with the patient. The patient was provided an opportunity to ask questions and all were answered. The patient agreed with the plan and demonstrated an understanding of the instructions.   The patient was advised to call back or seek an in-person evaluation if the symptoms worsen or if the condition fails to improve as anticipated.  I provided 16 minutes of non-face-to-face time during this encounter including median intraservice time, reviewing previous notes, labs, imaging, medications and explaining diagnosis and management.  Gildardo Pounds, FNP-BC

## 2019-08-22 ENCOUNTER — Other Ambulatory Visit: Payer: Self-pay | Admitting: Nurse Practitioner

## 2019-08-22 ENCOUNTER — Other Ambulatory Visit: Payer: Self-pay

## 2019-08-22 ENCOUNTER — Ambulatory Visit: Payer: Self-pay | Attending: Nurse Practitioner | Admitting: *Deleted

## 2019-08-22 VITALS — BP 150/62 | HR 80 | Resp 16

## 2019-08-22 DIAGNOSIS — I10 Essential (primary) hypertension: Secondary | ICD-10-CM

## 2019-08-22 MED ORDER — HYDRALAZINE HCL 100 MG PO TABS: 100.0000 mg | ORAL_TABLET | Freq: Two times a day (BID) | ORAL | 2 refills | Status: DC

## 2019-08-22 NOTE — Progress Notes (Signed)
Hydralazine makes her feel uncomfortable when she take 1 po TID. Patient states she can hear her heart beating in ears and palpitations.Normally she takes Hydralazine 50 mg  BID.  She has readings of BP in hand.  SBP ranges- 133-174  DBP ranges 55-78 133/55 -174/77   DBP ranges 55-78 No changes in diet. Does not add salt to food. Denies chest pain, SOB, dizziness  Today BP reading is:150/62  Patient has taken 1 Hydralazine 50 mg 6 hours ago.   Changes were made to BP medication by PCP. Patient aware and verbalized understanding. Will follow up with question via MyChart as needed.

## 2019-08-22 NOTE — Patient Instructions (Signed)
Please look at your dose change for Hydralazine. Medication has been sent to the pharmacy.  Please inform your PCP with concerns about medication adjustments via MyChart.

## 2019-11-04 ENCOUNTER — Other Ambulatory Visit: Payer: Self-pay | Admitting: Nurse Practitioner

## 2019-11-04 DIAGNOSIS — I10 Essential (primary) hypertension: Secondary | ICD-10-CM

## 2019-11-04 NOTE — Telephone Encounter (Signed)
Patient calls requesting refills of the 50 mg hydralazine tablets. She states that the 100 mg tablets are too strong for her. Will forward to PCP for review.

## 2019-11-07 ENCOUNTER — Encounter: Payer: Self-pay | Admitting: Nurse Practitioner

## 2019-12-24 ENCOUNTER — Encounter: Payer: Self-pay | Admitting: Nurse Practitioner

## 2020-02-26 ENCOUNTER — Encounter: Payer: Self-pay | Admitting: Nurse Practitioner

## 2020-02-26 ENCOUNTER — Other Ambulatory Visit: Payer: Self-pay | Admitting: Nurse Practitioner

## 2020-02-26 DIAGNOSIS — I10 Essential (primary) hypertension: Secondary | ICD-10-CM

## 2020-02-26 MED ORDER — HYDRALAZINE HCL 50 MG PO TABS: 50.0000 mg | ORAL_TABLET | Freq: Three times a day (TID) | ORAL | 2 refills | Status: DC

## 2020-03-03 ENCOUNTER — Other Ambulatory Visit: Payer: Self-pay | Admitting: Nurse Practitioner

## 2020-03-03 DIAGNOSIS — I10 Essential (primary) hypertension: Secondary | ICD-10-CM

## 2020-07-08 ENCOUNTER — Other Ambulatory Visit: Payer: Self-pay | Admitting: Nurse Practitioner

## 2020-07-08 ENCOUNTER — Other Ambulatory Visit: Payer: Self-pay | Admitting: Family Medicine

## 2020-07-08 DIAGNOSIS — I10 Essential (primary) hypertension: Secondary | ICD-10-CM

## 2020-07-08 NOTE — Telephone Encounter (Signed)
Call to patient - appointment scheduled- RF granted until appointment- stressed importance of keeping appointment. #[phone] given

## 2020-08-03 DIAGNOSIS — R69 Illness, unspecified: Secondary | ICD-10-CM | POA: Diagnosis not present

## 2020-08-17 ENCOUNTER — Encounter: Payer: Self-pay | Admitting: Nurse Practitioner

## 2020-08-17 ENCOUNTER — Other Ambulatory Visit: Payer: Self-pay

## 2020-08-17 ENCOUNTER — Ambulatory Visit: Payer: Medicare HMO | Attending: Nurse Practitioner | Admitting: Nurse Practitioner

## 2020-08-17 VITALS — BP 157/65 | HR 74 | Temp 96.5°F | Ht 67.0 in | Wt 156.0 lb

## 2020-08-17 DIAGNOSIS — Z1211 Encounter for screening for malignant neoplasm of colon: Secondary | ICD-10-CM | POA: Diagnosis not present

## 2020-08-17 DIAGNOSIS — L989 Disorder of the skin and subcutaneous tissue, unspecified: Secondary | ICD-10-CM

## 2020-08-17 DIAGNOSIS — I10 Essential (primary) hypertension: Secondary | ICD-10-CM

## 2020-08-17 DIAGNOSIS — Z1231 Encounter for screening mammogram for malignant neoplasm of breast: Secondary | ICD-10-CM | POA: Diagnosis not present

## 2020-08-17 DIAGNOSIS — Z780 Asymptomatic menopausal state: Secondary | ICD-10-CM

## 2020-08-17 MED ORDER — METOPROLOL SUCCINATE ER 50 MG PO TB24: ORAL_TABLET | ORAL | 1 refills | Status: DC

## 2020-08-17 MED ORDER — HYDRALAZINE HCL 50 MG PO TABS: 50.0000 mg | ORAL_TABLET | Freq: Three times a day (TID) | ORAL | 1 refills | Status: DC

## 2020-08-17 NOTE — Progress Notes (Signed)
Assessment & Plan:  [name] was seen today for hypertension.  Diagnoses and all orders for this visit:  Essential hypertension -     hydrALAZINE (APRESOLINE) 50 MG tablet; Take 1 tablet (50 mg total) by mouth 3 (three) times daily. -     metoprolol succinate (TOPROL-XL) 50 MG 24 hr tablet; TAKE 1 TABLET BY MOUTH ONCE DAILY -     CMP14+EGFR -     CBC -     Lipid panel Continue all antihypertensives as prescribed.  Remember to bring in your blood pressure log with you for your follow up appointment.  DASH/Mediterranean Diets are healthier choices for HTN.    Colon cancer screening -     Fecal occult blood, imunochemical([facility])  Breast cancer screening by mammogram -     MM 3D SCREEN BREAST BILATERAL; Future  Skin lesion of chest wall -     Ambulatory referral to Dermatology  Postmenopausal estrogen deficiency -     DG Bone Density; Future    Patient has been counseled on age-appropriate routine health concerns for screening and prevention. These are reviewed and up-to-date. Referrals have been placed accordingly. Immunizations are up-to-date or declined.    Subjective:   Chief Complaint  Patient presents with   Hypertension    Pt. is here for blood pressure check. Pt. stated when she takes Hydralazine 3x a day, she gets a mild headache.    HPI Paula Ali 64 y.o. female presents to office today  for follow up to HTN.  Patient has been counseled on age-appropriate routine health concerns for screening and prevention. These are reviewed and up-to-date. Referrals have been placed accordingly. Immunizations are up-to-date or declined.    MAMMOGRAM: Referral placed COLONOSCOPY: FOBT sent home with Paula Ali today   Essential Hypertension Notes systolic Bp readings at home 140-150s. Having headaches with taking hydralazine 3 times per day. Has only been taking twice per day.  Denies any side effects with taking metoprolol XL 50 mg daily. I  have recommended she take metoprolol in the morning, 1st dose of hydralazine mid day and 2nd dose at bedtime. She currently denies chest pain, shortness of breath, palpitations, lightheadedness, dizziness, headaches or BLE edema.  BP Readings from Last 3 Encounters:  08/17/20 (!) 157/65  08/22/19 (!) 150/62  06/27/19 (!) 175/71    SKIN LESION She has a hyperpigmented macular skin lesion on her chest. Unsure how long it has been there. It is significantly larger than any other macular lesions on her chest.      Review of Systems  Constitutional: Negative for fever, malaise/fatigue and weight loss.  HENT: Negative.  Negative for nosebleeds.   Eyes: Negative.  Negative for blurred vision, double vision and photophobia.  Respiratory: Negative.  Negative for cough and shortness of breath.   Cardiovascular: Negative.  Negative for chest pain, palpitations and leg swelling.  Gastrointestinal: Negative.  Negative for heartburn, nausea and vomiting.  Musculoskeletal: Negative.  Negative for myalgias.  Skin:       SEE HPI  Neurological: Negative.  Negative for dizziness, focal weakness, seizures and headaches.  Psychiatric/Behavioral: Negative.  Negative for suicidal ideas.    Past Medical History:  Diagnosis Date   Hypertension     Past Surgical History:  Procedure Laterality Date   TONSILLECTOMY      History reviewed. No pertinent family history.  Social History Reviewed with no changes to be made today.   Outpatient Medications Prior to Visit  Medication Sig  Refill  °• hydrALAZINE (APRESOLINE) 50 MG tablet TAKE 1 TABLET BY MOUTH THREE TIMES DAILY 30 tablet 0  °• metoprolol succinate (TOPROL-XL) 50 MG 24 hr tablet TAKE 1 TABLET BY MOUTH ONCE DAILY MUST  HAVE  OFFICE  VISIT  FOR  REFILLS 41 tablet 0  °• esomeprazole (NEXIUM) 20 MG capsule Take 1 capsule (20 mg total) by mouth 2 (two) times daily before a meal. (Patient not taking: Reported on 04/16/2019) 60 capsule 1   ° °No facility-administered medications prior to visit.  ° ° °Allergies  °Allergen Reactions  °• Lisinopril   °  Lower lip swelling  °• Amoxicillin   °• Losartan Potassium   °  Hyponatremia °  ° ° °   °Objective:  °  °BP (!) 157/65 (BP Location: Left Arm, Patient Position: Sitting, Cuff Size: Normal)    Pulse 74    Temp (!) 96.5 °F (35.8 °C) (Temporal)    Ht 5' 7" (1.702 m)    Wt 156 lb (70.8 kg)    SpO2 98%    BMI 24.43 kg/m²  °Wt Readings from Last 3 Encounters:  °08/17/20 156 lb (70.8 kg)  °05/09/19 169 lb 9.6 oz (76.9 kg)  °04/16/19 170 lb (77.1 kg)  ° ° °Physical Exam °Vitals and nursing note reviewed.  °Constitutional:   °   Appearance: She is well-developed.  °HENT:  °   Head: Normocephalic and atraumatic.  °Cardiovascular:  °   Rate and Rhythm: Normal rate and regular rhythm.  °   Heart sounds: Normal heart sounds. No murmur heard.  °No friction rub. No gallop.   °Pulmonary:  °   Effort: Pulmonary effort is normal. No tachypnea or respiratory distress.  °   Breath sounds: Normal breath sounds. No decreased breath sounds, wheezing, rhonchi or rales.  °Chest:  °   Chest wall: No tenderness.  °Abdominal:  °   General: Bowel sounds are normal.  °   Palpations: Abdomen is soft.  °Musculoskeletal:     °   General: Normal range of motion.  °   Cervical back: Normal range of motion.  °Skin: °   General: Skin is warm and dry.  °Neurological:  °   Mental Status: She is alert and oriented to person, place, and time.  °   Coordination: Coordination normal.  °Psychiatric:     °   Behavior: Behavior normal. Behavior is cooperative.     °   Thought Content: Thought content normal.     °   Judgment: Judgment normal.  ° ° ° ° ° °   °Patient has been counseled extensively about nutrition and exercise as well as the importance of adherence with medications and regular follow-up. The patient was given clear instructions to go to ER or return to medical center if symptoms don't improve, worsen or new problems develop. The  patient verbalized understanding.  ° °Follow-up: Return in about 3 months (around 11/15/2020).  ° °Zelda W Fleming, FNP-BC °[facility] °[place], [place] °[phone]   °08/17/2020, 5:06 PM °

## 2020-08-18 LAB — CMP14+EGFR
ALT: 13 IU/L (ref 0–32)
AST: 14 IU/L (ref 0–40)
Albumin/Globulin Ratio: 1.9 (ref 1.2–2.2)
Albumin: 4.5 g/dL (ref 3.8–4.8)
Alkaline Phosphatase: 74 IU/L (ref 44–121)
BUN/Creatinine Ratio: 14 (ref 12–28)
BUN: 10 mg/dL (ref 8–27)
Bilirubin Total: 0.3 mg/dL (ref 0.0–1.2)
CO2: 25 mmol/L (ref 20–29)
Calcium: 9.8 mg/dL (ref 8.7–10.3)
Chloride: 103 mmol/L (ref 96–106)
Creatinine, Ser: 0.74 mg/dL (ref 0.57–1.00)
GFR calc Af Amer: 98 mL/min/{1.73_m2} (ref >59)
GFR calc non Af Amer: 85 mL/min/{1.73_m2} (ref >59)
Globulin, Total: 2.4 g/dL (ref 1.5–4.5)
Glucose: 109 mg/dL — ABNORMAL HIGH (ref 65–99)
Potassium: 4.5 mmol/L (ref 3.5–5.2)
Sodium: 139 mmol/L (ref 134–144)
Total Protein: 6.9 g/dL (ref 6.0–8.5)

## 2020-08-18 LAB — LIPID PANEL
Chol/HDL Ratio: 4 ratio (ref 0.0–4.4)
Cholesterol, Total: 220 mg/dL — ABNORMAL HIGH (ref 100–199)
HDL: 55 mg/dL (ref >39)
LDL Chol Calc (NIH): 147 mg/dL — ABNORMAL HIGH (ref 0–99)
Triglycerides: 103 mg/dL (ref 0–149)
VLDL Cholesterol Cal: 18 mg/dL (ref 5–40)

## 2020-08-18 LAB — CBC
Hematocrit: 39.3 % (ref 34.0–46.6)
Hemoglobin: 13 g/dL (ref 11.1–15.9)
MCH: 31.4 pg (ref 26.6–33.0)
MCHC: 33.1 g/dL (ref 31.5–35.7)
MCV: 95 fL (ref 79–97)
Platelets: 242 10*3/uL (ref 150–450)
RBC: 4.14 x10E6/uL (ref 3.77–5.28)
RDW: 11.7 % (ref 11.7–15.4)
WBC: 6.4 10*3/uL (ref 3.4–10.8)

## 2020-08-24 DIAGNOSIS — Z1211 Encounter for screening for malignant neoplasm of colon: Secondary | ICD-10-CM | POA: Diagnosis not present

## 2020-08-25 LAB — FECAL OCCULT BLOOD, IMMUNOCHEMICAL: Fecal Occult Bld: NEGATIVE

## 2020-09-14 ENCOUNTER — Ambulatory Visit (HOSPITAL_COMMUNITY): Admission: EM | Admit: 2020-09-14 | Discharge: 2020-09-14 | Payer: Medicare HMO

## 2020-09-14 ENCOUNTER — Ambulatory Visit: Payer: Self-pay | Admitting: *Deleted

## 2020-09-14 ENCOUNTER — Encounter (HOSPITAL_COMMUNITY): Payer: Self-pay | Admitting: *Deleted

## 2020-09-14 ENCOUNTER — Emergency Department (HOSPITAL_COMMUNITY)
Admission: EM | Admit: 2020-09-14 | Discharge: 2020-09-15 | Disposition: A | Discharge status: Home / Self Care | Payer: Medicare HMO | Attending: Emergency Medicine | Admitting: Emergency Medicine

## 2020-09-14 ENCOUNTER — Other Ambulatory Visit: Payer: Self-pay

## 2020-09-14 DIAGNOSIS — Z5321 Procedure and treatment not carried out due to patient leaving prior to being seen by health care provider: Secondary | ICD-10-CM | POA: Diagnosis not present

## 2020-09-14 DIAGNOSIS — I160 Hypertensive urgency: Secondary | ICD-10-CM | POA: Diagnosis not present

## 2020-09-14 DIAGNOSIS — I10 Essential (primary) hypertension: Secondary | ICD-10-CM | POA: Insufficient documentation

## 2020-09-14 DIAGNOSIS — R202 Paresthesia of skin: Secondary | ICD-10-CM | POA: Diagnosis not present

## 2020-09-14 LAB — CBC
HCT: 39.3 % (ref 36.0–46.0)
Hemoglobin: 12.9 g/dL (ref 12.0–15.0)
MCH: 31.5 pg (ref 26.0–34.0)
MCHC: 32.8 g/dL (ref 30.0–36.0)
MCV: 95.9 fL (ref 80.0–100.0)
Platelets: 215 10*3/uL (ref 150–400)
RBC: 4.1 MIL/uL (ref 3.87–5.11)
RDW: 11.8 % (ref 11.5–15.5)
WBC: 5.2 10*3/uL (ref 4.0–10.5)
nRBC: 0 % (ref 0.0–0.2)

## 2020-09-14 LAB — BASIC METABOLIC PANEL
Anion gap: 8 (ref 5–15)
BUN: 5 mg/dL — ABNORMAL LOW (ref 8–23)
CO2: 25 mmol/L (ref 22–32)
Calcium: 9.7 mg/dL (ref 8.9–10.3)
Chloride: 102 mmol/L (ref 98–111)
Creatinine, Ser: 0.76 mg/dL (ref 0.44–1.00)
GFR, Estimated: >60 mL/min (ref >60)
Glucose, Bld: 111 mg/dL — ABNORMAL HIGH (ref 70–99)
Potassium: 4.2 mmol/L (ref 3.5–5.1)
Sodium: 135 mmol/L (ref 135–145)

## 2020-09-14 NOTE — ED Provider Notes (Signed)
[place]    CSN: [id number] Arrival date & time: 09/14/20  1257      History   Chief Complaint Chief Complaint  Patient presents with  . Dehydration    HPI Paula Ali is a 64 y.o. female presenting for right lip numbness/tingling and right thumb numbness/tingling x3 days (since 09/11/2020). History of hypertension, tension headache, hyponatremia >3 years ago. States that she started experiencing this tingling 3 days ago, and it's been constant since then. Has checked her BP at home and this is running 150s at home. States this feels similar to how she felt when she had low sodium levels so she started drinking plenty of gatorade and fluids at home, but without improvement. Denies chest pain, headaches/vision changes, shortness of breath, urinary symptoms, weakness/sensation changes in extremities. Taking medications as directed.   HPI  Past Medical History:  Diagnosis Date  . Hypertension     Patient Active Problem List   Diagnosis Date Noted  . Psoriasis 08/24/2017  . Essential hypertension 08/24/2017  . 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 TENSION HEADACHE 11/15/2006  . MIGRAINE, UNSPEC., W/O INTRACTABLE MIGRAINE 11/15/2006  . MENORRHAGIA 11/15/2006  . MENOPAUSAL SYNDROME 11/15/2006    Past Surgical History:  Procedure Laterality Date  . TONSILLECTOMY      OB History    Gravida  3   Para      Term      Preterm      AB      Living  3     SAB      IAB      Ectopic      Multiple      Live Births  3            Home Medications    Prior to Admission medications   Medication Sig Start Date End Date Taking? Authorizing Provider  hydrALAZINE (APRESOLINE) 50 MG tablet Take 1 tablet (50 mg total) by mouth 3 (three) times daily. 08/17/20  Yes Gildardo Pounds, NP  metoprolol succinate (TOPROL-XL) 50 MG 24 hr tablet TAKE 1 TABLET BY MOUTH ONCE DAILY 08/17/20  Yes Gildardo Pounds, NP    Family History History reviewed. No  pertinent family history.  Social History Social History   Tobacco Use  . Smoking status: Never Smoker  . Smokeless tobacco: Never Used  Vaping Use  . Vaping Use: Never used  Substance Use Topics  . Alcohol use: No  . Drug use: No     Allergies   Lisinopril, Amlodipine besylate, Amoxicillin, and Losartan potassium   Review of Systems Review of Systems  Musculoskeletal:       Right thumb tingling and right upper/lower lip tingling  All other systems reviewed and are negative.    Physical Exam Triage Vital Signs ED Triage Vitals  Enc Vitals Group     BP 09/14/20 1448 (!) 221/69      Pulse Rate 09/14/20 1448 76     Resp 09/14/20 1448 18     Temp 09/14/20 1448 98.1 F (36.7 C)     Temp Source 09/14/20 1448 Oral     SpO2 09/14/20 1448 100 %     Weight 09/14/20 1451 156 lb (70.8 kg)     Height 09/14/20 1451 5\' 7"  (1.702 m)     Head Circumference --      Peak Flow --      Pain Score 09/14/20 1451 0     Pain Loc --  Pain Edu? --      Excl. in [place]? --    No data found.  Updated Vital Signs BP (!) 221/69 (BP Location: Right Arm)   Pulse 76   Temp 98.1 F (36.7 C) (Oral)   Resp 18   Ht 5\' 7"  (1.702 m)   Wt 156 lb (70.8 kg)   SpO2 100%   BMI 24.43 kg/m   Visual Acuity Right Eye Distance:   Left Eye Distance:   Bilateral Distance:    Right Eye Near:   Left Eye Near:    Bilateral Near:     Physical Exam Vitals reviewed.  Constitutional:      General: She is not in acute distress.    Appearance: Normal appearance. She is not ill-appearing.  HENT:     Head: Normocephalic and atraumatic.     Mouth/Throat:     Mouth: Mucous membranes are moist.     Pharynx: Uvula midline.  Eyes:     Extraocular Movements: Extraocular movements intact.     Pupils: Pupils are equal, round, and reactive to light.  Cardiovascular:     Rate and Rhythm: Normal rate and regular rhythm.     Heart sounds: Normal heart sounds.  Pulmonary:     Effort: Pulmonary effort is  normal.     Breath sounds: Normal breath sounds. No wheezing, rhonchi or rales.  Abdominal:     General: Abdomen is flat.     Palpations: Abdomen is soft.     Tenderness: There is no abdominal tenderness. There is no guarding or rebound.  Musculoskeletal:     Cervical back: Normal range of motion and neck supple. No rigidity.  Lymphadenopathy:     Cervical: No cervical adenopathy.  Neurological:     General: No focal deficit present.     Mental Status: She is alert and oriented to person, place, and time. Mental status is at baseline.     Cranial Nerves: Cranial nerves are intact. No cranial nerve deficit or facial asymmetry.     Sensory: Sensation is intact. No sensory deficit.     Motor: Motor function is intact. No weakness.     Coordination: Coordination is intact. Coordination normal.     Gait: Gait is intact. Gait normal.     Comments: CN 2-12 intact. No weakness or numbness in UEs or LEs. R hand strength and sensation intact. No facial asymmetry.  Psychiatric:        Mood and Affect: Mood normal.        Behavior: Behavior normal.        Thought Content: Thought content normal.        Judgment: Judgment normal.      UC Treatments / Results  Labs (all labs ordered are listed, but only abnormal results are displayed) Labs Reviewed - No data to display  EKG   Radiology No results found.  Procedures Procedures (including critical care time)  Medications Ordered in UC Medications - No data to display  Initial Impression / Assessment and Plan / UC Course  I have reviewed the triage vital signs and the nursing notes.  Pertinent labs & imaging results that were available during my care of the patient were reviewed by me and considered in my medical decision making (see chart for details).     BP initially 221/76, 215/75 on recheck. Pt with right-sided lip tingling and right-sided thumb tingling x3 days, unchanged. Denies headaches, vision changes, shortness of  breath, chest pain, weakness/sensation changes  in UEs/LEs, urinary sx, back pain. CN 2-12 grossly intact. States she has been checking BP at home and this is running 150s-180s at home. Taking her metoprolol and hydralazine as directed.   History of hyponatremia >3 years ago (08/24/2017). Sodium was checked 3 weeks ago (08/17/2020) and was in normal range (139).   We discussed that given her symptoms and significantly elevated blood pressure, she must head straight to the ER. She is hemodynamically stable to transport herself at this time.   Final Clinical Impressions(s) / UC Diagnoses   Final diagnoses:  Hypertensive urgency     Discharge Instructions     Please head straight to [facility] ER for further evaluation of elevated blood pressure. Call EMS if you experience chest pain, vision changes, headaches, shortness of breath, etc while on the way to the ER.     ED Prescriptions    None     PDMP not reviewed this encounter.   Rhys Martini, PA-C 09/14/20 1612

## 2020-09-14 NOTE — ED Triage Notes (Signed)
Reported Pt Sx's to Dr Tracie Harrier

## 2020-09-14 NOTE — ED Triage Notes (Signed)
Pt reports she has  Had a low sodium in the past . Pt reports tingling on tip of Rt thumb and Upper lip. Pt thinks she had the same sensation the last time her sodium was low.

## 2020-09-14 NOTE — Discharge Instructions (Addendum)
Please head straight to [facility] ER for further evaluation of elevated blood pressure. Call EMS if you experience chest pain, vision changes, headaches, shortness of breath, etc while on the way to the ER.

## 2020-09-14 NOTE — Telephone Encounter (Signed)
  Pt called in with several issues.  She had problems with her sodium being low while she was taking losartan, which she is no longer taking, and having these same symptoms.   See below. She is concerned because usually if she has these symptoms she can drink Gator Aid and/or Pedialytle and these symptoms go away.  I have referred her to the urgent care as she wants to have blood work done to see if her sodium is low and due to her symptoms.  She was agreeable and is going to the [facility] now.   Reason for Disposition . [1] Caller has URGENT medicine question about med that PCP or specialist prescribed AND [2] triager unable to answer question    Pt linking these feelings to losartan that she took but is no longer on.  The low sodium happened when on losartan.   I also had these symptoms prior to ever being on the losartan.   Not diabetic.  Answer Assessment - Initial Assessment Questions 1. NAME of MEDICATION: "What medicine are you calling about?"     I'm having the same feeling as when my sodium was low when I took losartan.   I'm not on losartan now.   Maybe I'm dehydrated.   If I drink Gator Aid it  Usually helps but it's not helping this time. 2. QUESTION: "What is your question?" (e.g., medication refill, side effect)     I have a very dry mouth, I have a tingling in my thumb on the tip on right hand.    I had cramps in my right hand on Christmas Eve.   3. PRESCRIBING HCP: "Who prescribed it?" Reason: if prescribed by specialist, call should be referred to that group.     I had these same symptoms when I was on losartan.   My sodium was low.  I was dehydrated is what caused these symptoms.   4. SYMPTOMS: "Do you have any symptoms?"     See above 5. SEVERITY: If symptoms are present, ask "Are they mild, moderate or severe?"     See above 6. PREGNANCY:  "Is there any chance that you are pregnant?" "When was your last menstrual period?"     N/A due to age  No shortness of  breath or dizziness or chest pain.      All these symptoms have been happening since Christmas Eve.   I usually drink Gator Aid and it helps these symptoms but this time it's not working.  Protocols used: MEDICATION QUESTION CALL-A-AH

## 2020-09-14 NOTE — ED Notes (Signed)
Patient is being discharged from the Urgent Care and sent to the Emergency Department via personal vehicle . Per provider Ignacia Bayley, patient is in need of higher level of care due to dehydration. Patient is aware and verbalizes understanding of plan of care.    Vitals:   09/14/20 1448  BP: (!) 221/69  Pulse: 76  Resp: 18  Temp: 98.1 F (36.7 C)  SpO2: 100%

## 2020-09-14 NOTE — ED Triage Notes (Signed)
Pt reports high blood pressure despite being compliant with medication for same. Also reports tingling in R thumb and r upper lip since 12/24. Tingling in lip has subsided, remains constant in thumb.

## 2020-09-15 ENCOUNTER — Ambulatory Visit: Payer: Self-pay | Admitting: Nurse Practitioner

## 2020-09-15 NOTE — Telephone Encounter (Signed)
Pt called to report elevated BP.  Stated she was advised by UC to go to ER yesterday due to BP of "215/?".  Pt. Stated she had several readings checked at ER, in the waiting room, that were high, and never saw a provider, or was never given any medication to treat it.  Pt. Stated she left the ER about 12:40 AM without being seen. Reported since Christmas, she has had a tingling in her right thumb and in her lips on the right side.  Denied any numbness of her face or in right extremities, with exception of the thumb.  Denied any weakness of extremities.  Denied any blurred vision.  Stated had headache last night, and attributed that to not eating all day.  Denied headache today.  Denied chest pain, or shortness of breath.  Reported that her BP came down last night after ret'd home from ER at 1:15 AM: 139/70 left arm, and 161/75 right arm. During triage call the following BP's reported: 195/82, pulse 84 @ 11:35 AM; 172/82, pulse 80 @ 11:45 AM.  Took Metoprolol today at 8:00 AM.  Hydralazine dose time is adjusted today, due to getting off schedule yesterday.  Sched. To take Hydralazine at 12:00 PM today.  No available appts. At [facility] This week.  Advised pt. To go to the [facility] today at the [facility] at [address] she will go there today.  Will route this note to PCP.       Reason for Disposition . Systolic BP  >= 023 OR Diastolic >= 343  Answer Assessment - Initial Assessment Questions 1. BLOOD PRESSURE: "What is the blood pressure?" "Did you take at least two measurements 5 minutes apart?"     Went to UC yesterday and advised to go to ER; pt. reported BP of "215/?"      1:15 AM 139/70 Left arm and 161/78 Right arm       8:00 AM 156/73, pulse 65       11:35 AM 195/82, pulse 84 2. ONSET: "When did you take your blood pressure?"     12/28 @ UC 3. HOW: "How did you obtain the blood pressure?" (e.g., visiting nurse, automatic home BP monitor)     Left the ER without  being seen; took BP at home w/ BP monitor 4. HISTORY: "Do you have a history of high blood pressure?"     yes 5. MEDICATIONS: "Are you taking any medications for blood pressure?" "Have you missed any doses recently?"     Took Metoprolol at 8:00 AM today.      Hydralazine due at 12:00 AM 6. OTHER SYMPTOMS: "Do you have any symptoms?" (e.g., headache, chest pain, blurred vision, difficulty breathing, weakness)     Denied HA, chest pain, blurred vision, weakness of extremities. Tingling in right thumb, and tingling on right side of lips.    7. PREGNANCY: "Is there any chance you are pregnant?" "When was your last menstrual period?"     n/a  Protocols used: BLOOD PRESSURE - HIGH-A-AH

## 2020-09-15 NOTE — ED Notes (Signed)
Pt did not want to wait and left will follow up with PCP.

## 2020-09-16 ENCOUNTER — Encounter: Payer: Self-pay | Admitting: Nurse Practitioner

## 2020-09-19 NOTE — Telephone Encounter (Signed)
Noted. I have been in contact with Mrs. Paula Ali

## 2020-09-21 ENCOUNTER — Other Ambulatory Visit: Payer: Self-pay

## 2020-09-21 ENCOUNTER — Ambulatory Visit: Payer: Medicare HMO | Attending: Nurse Practitioner

## 2020-09-21 DIAGNOSIS — I10 Essential (primary) hypertension: Secondary | ICD-10-CM

## 2020-09-21 LAB — POCT URINALYSIS DIP (CLINITEK)
Bilirubin, UA: NEGATIVE
Blood, UA: NEGATIVE
Glucose, UA: NEGATIVE mg/dL
Ketones, POC UA: NEGATIVE mg/dL
Nitrite, UA: NEGATIVE
POC PROTEIN,UA: NEGATIVE
Spec Grav, UA: 1.015 (ref 1.010–1.025)
Urobilinogen, UA: 0.2 E.U./dL
pH, UA: 6.5 (ref 5.0–8.0)

## 2020-09-22 ENCOUNTER — Encounter: Payer: Self-pay | Admitting: Nurse Practitioner

## 2020-09-30 ENCOUNTER — Encounter: Payer: Self-pay | Admitting: Nurse Practitioner

## 2020-10-01 ENCOUNTER — Other Ambulatory Visit: Payer: Self-pay | Admitting: Nurse Practitioner

## 2020-10-01 MED ORDER — NIFEDIPINE ER OSMOTIC RELEASE 30 MG PO TB24: 30.0000 mg | ORAL_TABLET | Freq: Every day | ORAL | 1 refills | Status: DC

## 2020-10-06 NOTE — Telephone Encounter (Signed)
Spoke to patient and scheduled an appt. W/ clinical pharmacist.

## 2020-10-19 NOTE — Progress Notes (Signed)
S:    PCP: Geryl Rankins PMH: HTN, hx of hyponatremia, migraines   Patient arrives in good spirits. Presents to the clinic for hypertension evaluation, counseling, and management. Patient was last seen by Primary Care Provider on 08/17/20. Her PCP referred her for a BP check on 10/01/2020.  HPI:  09/22/20: patient sent mychart message regarding tingling and numbness in thumb and mouth after taking hydralazine. She also endorsed symptoms of dehydration. Patient stopped hydralazine for a few days and tingling/numbeness moderately improved. Therefore, hydralazine was discontinued and Nifedipine XL 30 mg daily was started on 10/01/20.   Today, patient reports self-decreasing Toprol XL to 25 mg daily instead of 50 mg daily as prescribed. Also reports taking nifedipine on/off since prescribed on 10/01/20 (last dose was 2 days ago). Patient did not bring home BP readings, however, reports "really good" BP readings in the evenings and "high" readings during the day. Patient denies dizziness, headaches, blurred vision, and LE swelling at home and in clinic today. She is willing to restart hydralazine if needed.   Medication adherence: poor.  Current BP Medications include:    Toprol XL 50 mg daily (taking 25 mg daily)  Nifedipine XL 30 mg daily (not taking)  Antihypertensives tried in the past include:   hydralazine 50 mg (TID frequency and higher dose caused headaches)  lisinopril (DC d/t lip swelling)  amlodipine 10 mg (DC d/t pedal edema, acid-reflux symptoms)  losartan-HCTZ (DC d/t hyponatremia)  HCTZ (hyponatremia)   carvedilol (acid reflux like symptoms)   Dietary habits include: will address at next visit Exercise habits include: will address at next visit Family / Social history:  -Fhx: -Tobacco use: denies  O:   Home BP readings: none   Last 3 Office BP readings: BP Readings from Last 3 Encounters:  10/20/20 (!) 196/78  09/14/20 (!) 198/78  09/14/20 (!) 221/69     BMET    Component Value Date/Time   NA 135 09/14/2020 1712   NA 139 08/17/2020 1708   K 4.2 09/14/2020 1712   CL 102 09/14/2020 1712   CO2 25 09/14/2020 1712   GLUCOSE 111 (H) 09/14/2020 1712   BUN 5 (L) 09/14/2020 1712   BUN 10 08/17/2020 1708   CREATININE 0.76 09/14/2020 1712   CALCIUM 9.7 09/14/2020 1712   GFRNONAA >60 09/14/2020 1712   GFRAA 98 08/17/2020 1708    Renal function: CrCl cannot be calculated (Patient's most recent lab result is older than the maximum 21 days allowed.).  Clinical ASCVD: No  The 10-year ASCVD risk score Mikey Bussing DC Jr., et al., 2013) is: 17.5%   Values used to calculate the score:     Age: 65 years     Sex: Female     Is Non-Hispanic African American: No     Diabetic: No     Tobacco smoker: No     Systolic Blood Pressure: 161 mmHg     Is BP treated: Yes     HDL Cholesterol: 55 mg/dL     Total Cholesterol: 220 mg/dL  A/P: Hypertension longstanding currently uncontrolled on current medications secondary to non-adherence. BP Goal = <130/80 mmHg. Medication adherence poor. She has a hx of multiple medication intolerances and she is worried that her HTN medications are causing tingling/burning sensations. Explained there could be other factors contributing to these symptoms unrelated to her medications, because majority of her medications have been adjusted/discontinued and symptoms are still persisting. Patient verbalized understanding. Additionally, she wants to stop metoprolol. Explained that discontinuing  a beta blocker will increase risk of palpitations, however, we can do a trial off of Toprol XL and start an equivalent dose of bisoprolol to determine if Toprol is the offending agent. Patient verbalized agreement.  -Started bisoprolol 2.5 mg daily -Restarted hydralazine 50 mg BID -Discontinued Toprol -Counseled on lifestyle modifications for blood pressure control including reduced dietary sodium, increased exercise, adequate sleep.  Results  reviewed and written information provided. Total time in face-to-face counseling 30 minutes.   F/U Clinic Visit in 2 weeks.    Lorel Monaco, PharmD, [place] PGY2 Ambulatory Care Resident [place]

## 2020-10-20 ENCOUNTER — Encounter: Payer: Self-pay | Admitting: Pharmacist

## 2020-10-20 ENCOUNTER — Ambulatory Visit: Payer: Medicare HMO | Attending: Nurse Practitioner | Admitting: Pharmacist

## 2020-10-20 ENCOUNTER — Other Ambulatory Visit: Payer: Self-pay

## 2020-10-20 VITALS — BP 196/78 | HR 76

## 2020-10-20 DIAGNOSIS — I10 Essential (primary) hypertension: Secondary | ICD-10-CM | POA: Diagnosis not present

## 2020-10-20 MED ORDER — HYDRALAZINE HCL 50 MG PO TABS: 50.0000 mg | ORAL_TABLET | Freq: Two times a day (BID) | ORAL | 2 refills | Status: DC

## 2020-10-20 MED ORDER — BISOPROLOL FUMARATE 5 MG PO TABS
2.5000 mg | ORAL_TABLET | Freq: Every day | ORAL | 0 refills | Status: DC
Start: 2020-10-20 — End: 2020-11-03

## 2020-10-23 ENCOUNTER — Encounter: Payer: Self-pay | Admitting: Nurse Practitioner

## 2020-10-25 ENCOUNTER — Other Ambulatory Visit: Payer: Self-pay | Admitting: Nurse Practitioner

## 2020-10-25 MED ORDER — BUSPIRONE HCL 7.5 MG PO TABS: 7.5000 mg | ORAL_TABLET | Freq: Three times a day (TID) | ORAL | 0 refills | Status: DC

## 2020-11-02 NOTE — Progress Notes (Unsigned)
S:     PCP: Geryl Rankins  Patient arrives in good spirits. Presents to the clinic for hypertension evaluation, counseling, and management. Patient was referred and last seen by Primary Care Provider on 08/17/20. Last seen by pharmacy on 10/20/20 and hydralazine 50 mg BID was restarted for BP management. Additionally, Toprol 50 mg daily was switched to an equivalent dose of Bisoprolol 2.5 mg daily to determine if Toprol is the agent causing tingling/burning sensations on thumb and lip.   Today, patient reports after restarting hydralazine 50 mg BID, she experienced continued/worsened tingling and burning sensations in lip/tongue/thumb 2 hours after 1st dose. Since stopping hydralazine, pt self-restarted nifedipine 30 mg XR daily and experienced anxiety and chest heaviness. PCP prescribed Buspar, however pt never started medication. Pt reports anxiety and chest heaviness resolved once stopping nifedipine. Pt reports as of now, only taking bisoprolol 2.5 mg daily (0.5 tab of 5 mg). Denies dizziness, headaches, and blurred vision. BP log reports mid 140s-160s/70s; HR 60s (see below).  Current BP Medications include:    Bisoprolol 2.5 mg daily  Hydralazine 50 mg BID (not taking)  Antihypertensives tried in the past include:   Nifedipine 30 mg daily (anxiety, chest heaviness) - added today  Hydralazine 50 BID (worsened tingling/burning sensation in lip/tongue/thumb) - added today  hydralazine 50 mg (TID frequency and higher dose caused headaches)  lisinopril (DC d/t lip swelling)  amlodipine 10 mg (DC d/t pedal edema, acid-reflux symptoms)  losartan-HCTZ (DC d/t hyponatremia)  HCTZ (hyponatremia)   carvedilol (acid reflux like symptoms)   Toprol XL 50 mg daily  Dietary habits include: will address at next visit Exercise habits include: will address at next visit Family / Social history:  -Fhx: -Tobacco use: denies   O:   Home BP readings:  BP: 152/80, 143/63, 148/70, 145/76,  155/77, 166/80, 147/70, 149/73, 159, 131/67, 136/79, 131/60, 128/66, 159/70, 146/70, 150/75, 162/71, 147/69, 167/84  HR: 60s  Last 3 Office BP readings: BP Readings from Last 3 Encounters:  11/03/20 (!) 201/77  10/20/20 (!) 196/78  09/14/20 (!) 198/78    BMET    Component Value Date/Time   NA 135 09/14/2020 1712   NA 139 08/17/2020 1708   K 4.2 09/14/2020 1712   CL 102 09/14/2020 1712   CO2 25 09/14/2020 1712   GLUCOSE 111 (H) 09/14/2020 1712   BUN 5 (L) 09/14/2020 1712   BUN 10 08/17/2020 1708   CREATININE 0.76 09/14/2020 1712   CALCIUM 9.7 09/14/2020 1712   GFRNONAA >60 09/14/2020 1712   GFRAA 98 08/17/2020 1708    Renal function: CrCl cannot be calculated (Patient's most recent lab result is older than the maximum 21 days allowed.).  Clinical ASCVD: No  The ASCVD Risk score Mikey Bussing DC Jr., et al., 2013) failed to calculate for the following reasons:   The valid systolic blood pressure range is 90 to 200 mmHg   A/P: Hypertension diagnosed currently uncontrolled on current medications. BP Goal = <130/80 mmHg. Control suboptimal due to poor medication adherence and hx of multiple intolerances. Discussed alternative options with PCP including reducing hydralazine to 25 mg BID, initiating spironolactone 12.5 mg daily with 1-week f/u BMET due to hx of hyponatremia, and clonidine patch. PCP agreed to try weekly clonidine patch for BP management. Discussed proper administration and placement of clonidine patch. Patient verbalized understanding. -Started clonidine 0.1 mg/24hr patch every 7 days -Discontinued hydralazine -Discontinued nifedipine -Continue bisoprolol 2.5 mg daily -Counseled on lifestyle modifications for blood pressure control including reduced  dietary sodium, increased exercise, adequate sleep.  Results reviewed and written information provided.   Total time in face-to-face counseling 35 minutes.   F/U Clinic Visit in 1 week.    Lorel Monaco, PharmD, [place] PGY2  Ambulatory Care Resident [place]

## 2020-11-03 ENCOUNTER — Encounter: Payer: Self-pay | Admitting: Pharmacist

## 2020-11-03 ENCOUNTER — Ambulatory Visit: Payer: Medicare HMO | Attending: Nurse Practitioner | Admitting: Pharmacist

## 2020-11-03 ENCOUNTER — Other Ambulatory Visit: Payer: Self-pay

## 2020-11-03 VITALS — BP 201/77 | HR 75

## 2020-11-03 DIAGNOSIS — I10 Essential (primary) hypertension: Secondary | ICD-10-CM

## 2020-11-03 MED ORDER — BISOPROLOL FUMARATE 5 MG PO TABS: 2.5000 mg | ORAL_TABLET | Freq: Every day | ORAL | 3 refills | Status: DC

## 2020-11-03 MED ORDER — CLONIDINE 0.1 MG/24HR TD PTWK: 0.1000 mg | MEDICATED_PATCH | TRANSDERMAL | 1 refills | Status: DC

## 2020-11-10 ENCOUNTER — Ambulatory Visit: Payer: Medicare HMO | Admitting: Pharmacist

## 2020-11-16 ENCOUNTER — Other Ambulatory Visit: Payer: Self-pay

## 2020-11-16 ENCOUNTER — Encounter: Payer: Self-pay | Admitting: Nurse Practitioner

## 2020-11-16 ENCOUNTER — Ambulatory Visit: Payer: Medicare HMO | Attending: Nurse Practitioner | Admitting: Nurse Practitioner

## 2020-11-16 DIAGNOSIS — L932 Other local lupus erythematosus: Secondary | ICD-10-CM

## 2020-11-16 DIAGNOSIS — T465X5A Adverse effect of other antihypertensive drugs, initial encounter: Secondary | ICD-10-CM

## 2020-11-16 DIAGNOSIS — I10 Essential (primary) hypertension: Secondary | ICD-10-CM

## 2020-11-16 MED ORDER — METOPROLOL SUCCINATE ER 25 MG PO TB24: 12.5000 mg | ORAL_TABLET | Freq: Every day | ORAL | 0 refills | Status: DC

## 2020-11-16 NOTE — Progress Notes (Signed)
Virtual Visit via Telephone Note Due to national recommendations of social distancing due to [place] 19, telehealth visit is felt to be most appropriate for this patient at this time.  I discussed the limitations, risks, security and privacy concerns of performing an evaluation and management service by telephone and the availability of in person appointments. I also discussed with the patient that there may be a patient responsible charge related to this service. The patient expressed understanding and agreed to proceed.    I connected with Carolyne Littles on 11/16/20  at   9:50 AM EST  EDT by telephone and verified that I am speaking with the correct person using two identifiers.   Consent I discussed the limitations, risks, security and privacy concerns of performing an evaluation and management service by telephone and the availability of in person appointments. I also discussed with the patient that there may be a patient responsible charge related to this service. The patient expressed understanding and agreed to proceed.   Location of Patient: Private  Residence   Location of Provider: [place] and [employer] Office    Persons participating in Telemedicine visit: Geryl Rankins FNP-BC [place]    History of Present Illness: Telemedicine visit for: Follow up to HTN She has a past medical history of Hypertension.  We have tried numerous antihypertensives including:  Amlodipine, carvedilol, hydralazine, HCTZ,  Lisinopril, toprol XL, nifedipine, and losartan All of which caused some type of reaction or side effect.   She is currently taking clonidine 0.1 mg weekly with average home readings; 115-130/60s. She does not after 5 days on the patch her blood pressure starts increasing to 150-160/70. She is still taking the bisoprolol but would like to stop this and restart metoprolol xl she has taken in the past.  She states she "did better"  on the toprol XL and feels the bisoprolol is too small when she has to half it.   BP Readings from Last 3 Encounters:  11/03/20 (!) 201/77  10/20/20 (!) 196/78  09/14/20 (!) 198/78   Anxiety She is not taking buspar. Using a calming tea to help with stress and anxiety.    She has been experiencing the following Symptoms over the past several months: tingling in her right thumb and right side of lip that occurs daily. 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 Also feels "dehydrated at times" and has to drink gatorade for this. She can not describe any specific symptoms that make her feel that she is dehydrated.    Past Medical History:  Diagnosis Date  . Hypertension     Past Surgical History:  Procedure Laterality Date  . TONSILLECTOMY      History reviewed. No pertinent family history.  Social History   Socioeconomic History  . Marital status: Married    Spouse name: Not on file  . Number of children: Not on file  . Years of education: Not on file  . Highest education level: Not on file  Occupational History  . Not on file  Tobacco Use  . Smoking status: Never Smoker  . Smokeless tobacco: Never Used  Vaping Use  . Vaping Use: Never used  Substance and Sexual Activity  . Alcohol use: No  . Drug use: No  . Sexual activity: Not on file  Other Topics Concern  . Not on file  Social History Narrative  . Not on file   Social Determinants  of Health   Financial Resource Strain: Not on file  Food Insecurity: Not on file  Transportation Needs: Not on file  Physical Activity: Not on file  Stress: Not on file  Social Connections: Not on file     Observations/Objective: Awake, alert and oriented x 3   Review of Systems  Constitutional: Negative for fever, malaise/fatigue and weight loss.  HENT: Negative.  Negative for nosebleeds.   Eyes: Negative.  Negative for blurred vision, double vision and photophobia.   Respiratory: Negative.  Negative for cough and shortness of breath.   Cardiovascular: Negative.  Negative for chest pain, palpitations and leg swelling.  Gastrointestinal: Negative.  Negative for heartburn, nausea and vomiting.  Musculoskeletal: Negative.  Negative for myalgias.  Neurological: Positive for tingling and sensory change. Negative for dizziness, focal weakness, seizures and headaches.  Psychiatric/Behavioral: Negative for suicidal ideas. The patient is nervous/anxious.     Assessment and Plan: Diagnoses and all orders for this visit:  Essential hypertension -     metoprolol succinate (TOPROL-XL) 25 MG 24 hr tablet; Take 0.5 tablets (12.5 mg total) by mouth daily.  Hydralazine syndrome -     ANA; Future -     Sedimentation Rate; Future -     Histone antibodies, IgG, blood; Future     Follow Up Instructions Return in about 3 months (around 02/16/2021).     I discussed the assessment and treatment plan with the patient. The patient was provided an opportunity to ask questions and all were answered. The patient agreed with the plan and demonstrated an understanding of the instructions.   The patient was advised to call back or seek an in-person evaluation if the symptoms worsen or if the condition fails to improve as anticipated.  I provided 18 minutes of non-face-to-face time during this encounter including median intraservice time, reviewing previous notes, labs, imaging, medications and explaining diagnosis and management.  Gildardo Pounds, FNP-BC

## 2020-11-17 ENCOUNTER — Other Ambulatory Visit: Payer: Self-pay

## 2020-11-17 ENCOUNTER — Ambulatory Visit: Payer: Medicare HMO | Attending: Nurse Practitioner

## 2020-11-17 DIAGNOSIS — T465X5A Adverse effect of other antihypertensive drugs, initial encounter: Secondary | ICD-10-CM

## 2020-11-17 DIAGNOSIS — L932 Other local lupus erythematosus: Secondary | ICD-10-CM

## 2020-11-18 LAB — SEDIMENTATION RATE: Sed Rate: 2 mm/hr (ref 0–40)

## 2020-11-18 LAB — ANA: Anti Nuclear Antibody (ANA): NEGATIVE

## 2020-11-18 LAB — HISTONE ANTIBODIES, IGG, BLOOD: Histone Ab: 0.9 Units (ref 0.0–0.9)

## 2020-11-25 ENCOUNTER — Other Ambulatory Visit: Payer: Self-pay

## 2020-11-25 ENCOUNTER — Emergency Department (HOSPITAL_COMMUNITY): Payer: Medicare HMO

## 2020-11-25 ENCOUNTER — Emergency Department (HOSPITAL_COMMUNITY)
Admission: EM | Admit: 2020-11-25 | Discharge: 2020-11-25 | Disposition: A | Discharge status: Home / Self Care | Payer: Medicare HMO | Attending: Emergency Medicine | Admitting: Emergency Medicine

## 2020-11-25 ENCOUNTER — Encounter (HOSPITAL_COMMUNITY): Payer: Self-pay | Admitting: Emergency Medicine

## 2020-11-25 DIAGNOSIS — R519 Headache, unspecified: Secondary | ICD-10-CM | POA: Diagnosis not present

## 2020-11-25 DIAGNOSIS — I6782 Cerebral ischemia: Secondary | ICD-10-CM | POA: Diagnosis not present

## 2020-11-25 DIAGNOSIS — H547 Unspecified visual loss: Secondary | ICD-10-CM | POA: Diagnosis not present

## 2020-11-25 DIAGNOSIS — R202 Paresthesia of skin: Secondary | ICD-10-CM | POA: Diagnosis not present

## 2020-11-25 DIAGNOSIS — I10 Essential (primary) hypertension: Secondary | ICD-10-CM | POA: Diagnosis not present

## 2020-11-25 DIAGNOSIS — R634 Abnormal weight loss: Secondary | ICD-10-CM | POA: Diagnosis not present

## 2020-11-25 LAB — CBG MONITORING, ED: Glucose-Capillary: 110 mg/dL — ABNORMAL HIGH (ref 70–99)

## 2020-11-25 LAB — CBC WITH DIFFERENTIAL/PLATELET
Abs Immature Granulocytes: 0.01 10*3/uL (ref 0.00–0.07)
Basophils Absolute: 0 10*3/uL (ref 0.0–0.1)
Basophils Relative: 1 %
Eosinophils Absolute: 0 10*3/uL (ref 0.0–0.5)
Eosinophils Relative: 1 %
HCT: 36.8 % (ref 36.0–46.0)
Hemoglobin: 12.4 g/dL (ref 12.0–15.0)
Immature Granulocytes: 0 %
Lymphocytes Relative: 23 %
Lymphs Abs: 1 10*3/uL (ref 0.7–4.0)
MCH: 32.4 pg (ref 26.0–34.0)
MCHC: 33.7 g/dL (ref 30.0–36.0)
MCV: 96.1 fL (ref 80.0–100.0)
Monocytes Absolute: 0.3 10*3/uL (ref 0.1–1.0)
Monocytes Relative: 6 %
Neutro Abs: 2.9 10*3/uL (ref 1.7–7.7)
Neutrophils Relative %: 69 %
Platelets: 180 10*3/uL (ref 150–400)
RBC: 3.83 MIL/uL — ABNORMAL LOW (ref 3.87–5.11)
RDW: 12.1 % (ref 11.5–15.5)
WBC: 4.2 10*3/uL (ref 4.0–10.5)
nRBC: 0 % (ref 0.0–0.2)

## 2020-11-25 LAB — BASIC METABOLIC PANEL
Anion gap: 4 — ABNORMAL LOW (ref 5–15)
BUN: 8 mg/dL (ref 8–23)
CO2: 25 mmol/L (ref 22–32)
Calcium: 9.3 mg/dL (ref 8.9–10.3)
Chloride: 106 mmol/L (ref 98–111)
Creatinine, Ser: 0.75 mg/dL (ref 0.44–1.00)
GFR, Estimated: >60 mL/min (ref >60)
Glucose, Bld: 110 mg/dL — ABNORMAL HIGH (ref 70–99)
Potassium: 4.4 mmol/L (ref 3.5–5.1)
Sodium: 135 mmol/L (ref 135–145)

## 2020-11-25 LAB — SEDIMENTATION RATE: Sed Rate: 10 mm/hr (ref 0–22)

## 2020-11-25 MED ORDER — HYDRALAZINE HCL 10 MG PO TABS
10.0000 mg | ORAL_TABLET | Freq: Once | ORAL | Status: DC
  Filled 2020-11-25: qty 1

## 2020-11-25 MED ORDER — METOPROLOL TARTRATE 25 MG PO TABS
25.0000 mg | ORAL_TABLET | Freq: Once | ORAL | Status: AC
  Administered 2020-11-25: 25 mg via ORAL
  Filled 2020-11-25: qty 1

## 2020-11-25 MED ORDER — CLONIDINE HCL 0.2 MG PO TABS: 0.2000 mg | ORAL_TABLET | Freq: Once | ORAL | Status: DC

## 2020-11-25 MED ORDER — METOPROLOL SUCCINATE ER 50 MG PO TB24: 50.0000 mg | ORAL_TABLET | Freq: Every day | ORAL | 0 refills | Status: DC

## 2020-11-25 MED ORDER — LORAZEPAM 2 MG/ML IJ SOLN
0.5000 mg | Freq: Once | INTRAMUSCULAR | Status: AC
  Administered 2020-11-25: 0.5 mg via INTRAVENOUS
  Filled 2020-11-25: qty 1

## 2020-11-25 NOTE — ED Provider Notes (Addendum)
[facility] EMERGENCY DEPARTMENT Provider Note   CSN: [id number] Arrival date & time: 11/25/20  0909     History Chief Complaint  Patient presents with  . Eye Pain    Paula Ali is a 65 y.o. female.  HPI   65-year-old female with past medical history of HTN presents to the emergency department with concern for high blood pressure and resolved blurry vision in the left eye.  Patient has been on multiple blood pressure medications in the past. She is constantly switching because she says develops "sensitivities" to medications and can no tolerate them.  Currently she is on a clonidine overnight patch and metoprolol once a day.  She was recently put on this regimen a couple weeks ago, initially she states her blood pressure was well controlled but now she is seeing top numbers in the 150s to 160s. No CP or sob. No headache.  She went to bed last night at 11 PM, got up to use restroom around 3 AM and felt normal besides a dry mouth.  When she woke up this morning around 615 she noticed that the entirety of her left eye visual field was blurry.  No blackness or vision loss.  No eye pain or redness. No temporal pain.  Patient states that this self resolved after about an hour when she took the patch off.  Patient also reveals that for the past 3 months she has been dealing with numbness of the right thumb and intermittent numbness of right upper lip. This has been evaluated by her PCP, unclear if any neuro imaging has been done as an outpatient.   Past Medical History:  Diagnosis Date  . Hypertension     Patient Active Problem List   Diagnosis Date Noted  . Psoriasis 08/24/2017  . Essential hypertension 08/24/2017  . 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 TENSION HEADACHE 11/15/2006  . MIGRAINE, UNSPEC., W/O INTRACTABLE MIGRAINE 11/15/2006  . MENORRHAGIA 11/15/2006  . MENOPAUSAL SYNDROME 11/15/2006    Past Surgical History:  Procedure Laterality Date  .  TONSILLECTOMY       OB History    Gravida  3   Para      Term      Preterm      AB      Living  3     SAB      IAB      Ectopic      Multiple      Live Births  3           History reviewed. No pertinent family history.  Social History   Tobacco Use  . Smoking status: Never Smoker  . Smokeless tobacco: Never Used  Vaping Use  . Vaping Use: Never used  Substance Use Topics  . Alcohol use: No  . Drug use: No    Home Medications Prior to Admission medications   Medication Sig Start Date End Date Taking? Authorizing Provider  busPIRone (BUSPAR) 7.5 MG tablet Take 1 tablet (7.5 mg total) by mouth 3 (three) times daily. 10/25/20   Fleming, Zelda W, NP  cloNIDine (CATAPRES - DOSED IN MG/24 HR) 0.1 mg/24hr patch Place 1 patch (0.1 mg total) onto the skin every 7 (seven) days. 11/03/20   Fleming, Zelda W, NP  metoprolol succinate (TOPROL-XL) 25 MG 24 hr tablet Take 0.5 tablets (12.5 mg total) by mouth daily. 11/16/20 02/14/21  Fleming, Zelda W, NP    Allergies    Lisinopril, Amlodipine besylate, Amoxicillin,   and Losartan potassium  Review of Systems   Review of Systems  Constitutional: Negative for chills and fever.  HENT: Negative for congestion.   Eyes: Positive for visual disturbance. Negative for photophobia, pain and discharge.  Respiratory: Negative for shortness of breath.   Cardiovascular: Negative for chest pain.  Gastrointestinal: Negative for abdominal pain, diarrhea and vomiting.  Genitourinary: Negative for dysuria.  Skin: Negative for rash.  Neurological: Positive for numbness. Negative for dizziness, seizures, facial asymmetry, speech difficulty and headaches.    Physical Exam Updated Vital Signs BP (!) 164/75   Pulse 71   Temp 97.6 F (36.4 C) (Oral)   Resp 10   Ht 5' 7" (1.702 m)   Wt 68 kg   SpO2 100%   BMI 23.49 kg/m   Physical Exam Vitals and nursing note reviewed.  Constitutional:      Appearance: Normal appearance.  HENT:      Head: Normocephalic.     Mouth/Throat:     Mouth: Mucous membranes are moist.  Cardiovascular:     Rate and Rhythm: Normal rate.  Pulmonary:     Effort: Pulmonary effort is normal. No respiratory distress.  Abdominal:     Palpations: Abdomen is soft.     Tenderness: There is no abdominal tenderness.  Skin:    General: Skin is warm.  Neurological:     General: No focal deficit present.     Mental Status: She is alert and oriented to person, place, and time. Mental status is at baseline.     Cranial Nerves: No cranial nerve deficit.     Motor: No weakness.  Psychiatric:        Mood and Affect: Mood normal.     ED Results / Procedures / Treatments   Labs (all labs ordered are listed, but only abnormal results are displayed) Labs Reviewed  CBC WITH DIFFERENTIAL/PLATELET - Abnormal; Notable for the following components:      Result Value   RBC 3.83 (*)    All other components within normal limits  CBG MONITORING, ED - Abnormal; Notable for the following components:   Glucose-Capillary 110 (*)    All other components within normal limits  BASIC METABOLIC PANEL    EKG EKG Interpretation  Date/Time:  Thursday November 25 2020 09:33:30 EST Ventricular Rate:  74 PR Interval:    QRS Duration: 93 QT Interval:  391 QTC Calculation: 434 R Axis:   74 Text Interpretation: Sinus rhythm NSR, no change from previous Confirmed by Lavenia Atlas ([id number]) on 11/25/2020 10:21:01 AM   Radiology No results found.  Procedures Procedures   Medications Ordered in ED Medications  metoprolol tartrate (LOPRESSOR) tablet 25 mg (25 mg Oral Given 11/25/20 1021)    ED Course  I have reviewed the triage vital signs and the nursing notes.  Pertinent labs & imaging results that were available during my care of the patient were reviewed by me and considered in my medical decision making (see chart for details).    MDM Rules/Calculators/A&P                           65 year old female  presents emergency department concern for hypertension, numbness of her right thumb and right lip as well as an episode of blurred vision that has self resolved.  Patient is mildly hypertensive on arrival.  I offered to give the patient multiple of her medications and she has declined due to "sensitivities".  Patient has  been on almost every blood pressure medication with some sort of side effect, currently she is declining any further medication for hypertension.  The ocular exam is unremarkable, cranial nerves are intact, eye pressures in the left eye are normal. She has no pain or redness of the left eye. No temporal TTP. CT and MRI of the head showed no acute finding like stroke.  Her symptoms have remained resolved during this visit.   She states that she is going to stop her clonidine as she thinks that this is what caused the blurry vision.   Had a long discussion with the patient to follow-up with her eye doctor for further ocular evaluation.  Low suspicion for anything  acute like retinal detachment, acute angle closure glaucoma, GCA.  ESR was negative, no temporal tenderness.  Low suspicion for stroke given the negative CT/MRI. Told the patient to follow up with her optometrist for further ocular evaluation of resolved monocular vision change. Do not feel there is need for emergent consult with resolved symptoms and no findings of stroke or acute ocular disease.  Had a long discussion with the patient and her husband about potential rebound hypertension if she stops the clonidine immediately.  She will call her primary doctor tomorrow to tell them about her medication changes and for further follow-up.  Patient will be discharged and treated as an outpatient.  Discharge plan and strict return to ED precautions discussed, patient verbalizes understanding and agreement.   Final Clinical Impression(s) / ED Diagnoses Final diagnoses:  None    Rx / DC Orders ED Discharge Orders    None        Lorelle Gibbs, DO 11/25/20 1631    Meli Faley, Alvin Critchley, DO 11/25/20 1935

## 2020-11-25 NOTE — Discharge Instructions (Addendum)
You have been seen and discharged from the emergency department.  Take the increased dose of metoprolol.  Follow-up with your eye doctor to have further evaluation ensure that there is not an ocular issue that caused her vision changes today.  Follow-up with your primary provider for reevaluation.  They need to be aware that you are stopping the clonidine medication, this can result in rebound hypertension.  They may want to prescribe you a different medicine.  Also make them aware of the increased metoprolol dose. If you have any worsening symptoms or further concerns for health please return to an emergency department for further evaluation.

## 2020-11-25 NOTE — ED Notes (Signed)
ED Provider at bedside. 

## 2020-11-26 ENCOUNTER — Telehealth: Payer: Self-pay | Admitting: Nurse Practitioner

## 2020-11-26 NOTE — Telephone Encounter (Signed)
Pt is a calling because she had a reaction to cloNIDine (CATAPRES - DOSED IN MG/24 HR) 0.1 mg/24hr patch [[id number]]  Pt lost her sight and went to the ER. Pt removed the patch and her vision was restored. Pt would like to know what she she do about her BP medication.    The ED did increase the Beta Blocker from 25mg  to 50mg .  Please advise Cb- [phone]

## 2020-11-28 NOTE — Telephone Encounter (Signed)
Continue clonidine patch and make an appt with an eye doctor.

## 2020-11-29 NOTE — Telephone Encounter (Signed)
Called pt stated she will not take her clonidine patch because she experiencing not only blurry vision, but a distorted figure , she stated she does not want to go near that medicine again, requested another medicine.   Please advice.

## 2020-12-04 ENCOUNTER — Other Ambulatory Visit: Payer: Self-pay | Admitting: Nurse Practitioner

## 2020-12-04 ENCOUNTER — Encounter: Payer: Self-pay | Admitting: Nurse Practitioner

## 2020-12-04 DIAGNOSIS — I10 Essential (primary) hypertension: Secondary | ICD-10-CM

## 2020-12-04 NOTE — Telephone Encounter (Signed)
She can continue the metoprolol for now. I am referring her to Cardiology for further recommendations. They will call her to schedule. I have tried her on most if not all of the cardiac medications and she has had a reported side effect to each one.

## 2020-12-06 NOTE — Telephone Encounter (Signed)
Called pt made aware / Verbalized understading

## 2020-12-13 ENCOUNTER — Other Ambulatory Visit: Payer: Self-pay | Admitting: Nurse Practitioner

## 2020-12-13 DIAGNOSIS — L409 Psoriasis, unspecified: Secondary | ICD-10-CM

## 2020-12-13 NOTE — Telephone Encounter (Signed)
  Notes to clinic: medication that is requested is not on current medication list  Last filled 2018 Review for continue use and new script   Requested Prescriptions  Pending Prescriptions Disp Refills   clobetasol cream (TEMOVATE) 0.05 % [Pharmacy Med Name: Clobetasol Propionate 0.05 % External Cream] 60 g 0    Sig: APPLY 1 APPLICATION TOPICALLY 2 TIMES DAILY      Dermatology:  Corticosteroids Passed - 12/13/2020 10:15 AM      Passed - Valid encounter within last 12 months    Recent Outpatient Visits           3 weeks ago Essential hypertension   [place], Vernia Buff, NP   1 month ago Essential hypertension   [place], Jarome Matin, RPH-CPP   1 month ago Essential hypertension   [place], Jarome Matin, RPH-CPP   3 months ago Essential hypertension   [place], Vernia Buff, NP   1 year ago Essential hypertension   [place], Vernia Buff, NP       Future Appointments             In 1 month Skeet Latch, MD [facility] [place], [facility]

## 2021-01-26 ENCOUNTER — Ambulatory Visit: Payer: Medicare HMO | Admitting: Cardiovascular Disease

## 2021-01-26 ENCOUNTER — Encounter: Payer: Self-pay | Admitting: Cardiovascular Disease

## 2021-01-26 ENCOUNTER — Other Ambulatory Visit: Payer: Self-pay

## 2021-01-26 VITALS — BP 238/90 | HR 72 | Ht 67.0 in | Wt 153.2 lb

## 2021-01-26 DIAGNOSIS — I10 Essential (primary) hypertension: Secondary | ICD-10-CM | POA: Diagnosis not present

## 2021-01-26 DIAGNOSIS — Z5181 Encounter for therapeutic drug level monitoring: Secondary | ICD-10-CM

## 2021-01-26 MED ORDER — SPIRONOLACTONE 25 MG PO TABS: ORAL_TABLET | ORAL | 3 refills | Status: DC

## 2021-01-26 NOTE — Progress Notes (Signed)
[facility] Initial Assessment:    Date:  01/26/2021   ID:  Paula Ali, DOB 01-Jul-1956, MRN [id number]  PCP:  Gildardo Pounds, NP  Cardiologist:  None  Nephrologist:  Referring MD: Gildardo Pounds, NP   CC: Hypertension  History of Present Illness:    Paula Ali is a 65 y.o. female with a hx of hypertension here to establish care in the hypertension clinic. On 11/25/2020 she presented to the ED with concern for high blood pressure and resolved blurry vision in the left eye. Her blood pressure at that time was in the 150s to 160s. Testing was negative for stroke and she attributed her symptoms to clonidine.  It was helping her BP but caused the above symptoms.  She has had intolerances to many blood pressure medications.   Today, she is fearful to take more medications because of frequent side effects. She first discovered her hypertension about 3 years ago. Prior to that, her blood pressure was averaging 140 and she wasn't alarmed by this, but then it gradually rose until she was dx with hypertension. When she checks her at home blood pressure lately it averages 140-170/60-80.  She reports occasional LE edema, and does not believe that she snores. She denies any chest pain, shortness of breath, or palpitations. No pre-syncope, syncope, or lightheadedness to note. She has no orthopnea or PND. For formal exercise she [facility] a [facility], walking for 30 minutes and lifting weights for 30 minutes.  For diet, she is drinking beet juice and buying canned greens. Also she uses multivitamin supplements, fish oil, and ginseng, gingko herbs. Most of her eating is in the evening, as she can go all day with only eating a little in the morning. She does not add salt to her food other than when boiling potatoes. In the morning, she will mix decaf and caffeineated coffees and drink 1 cup, and drinks maybe 1 soda a day. For pain, she will  occasionally take tylenol and rarely aleve. She does not drink alcohol and was never a smoker. She is currently only taking metoprolol, and tolerates it.  When she misses a dose her heart races.  Her mother had hypertension for a short period of time.  Her son is 7 yo and he has hypertension due to unknown cause.  Previous antihypertensives: Clonidine patch-blurry vision Losartan Hydralazine Amlodipine-LE edema Lysinopril Carvedilol Bisoprolol   Past Medical History:  Diagnosis Date  . Hypertension     Past Surgical History:  Procedure Laterality Date  . TONSILLECTOMY      Current Medications: Current Meds  Medication Sig  . metoprolol succinate (TOPROL-XL) 50 MG 24 hr tablet Take 1 tablet (50 mg total) by mouth daily.  Marland Kitchen spironolactone (ALDACTONE) 25 MG tablet TAKE 1/2 TABLET DAILY     Allergies:   Lisinopril, Amlodipine besylate, Amoxicillin, Clonidine, Hydralazine hcl, and Losartan potassium   Social History   Socioeconomic History  . Marital status: Married    Spouse name: Not on file  . Number of children: Not on file  . Years of education: Not on file  . Highest education level: Not on file  Occupational History  . Not on file  Tobacco Use  . Smoking status: Never Smoker  . Smokeless tobacco: Never Used  Vaping Use  . Vaping Use: Never used  Substance and Sexual Activity  . Alcohol use: No  . Drug use: No  . Sexual activity: Not  on file  Other Topics Concern  . Not on file  Social History Narrative  . Not on file   Social Determinants of Health   Financial Resource Strain: Low Risk   . Difficulty of Paying Living Expenses: Not hard at all  Food Insecurity: No Food Insecurity  . Worried About Charity fundraiser in the Last Year: Never true  . Ran Out of Food in the Last Year: Never true  Transportation Needs: No Transportation Needs  . Lack of Transportation (Medical): No  . Lack of Transportation (Non-Medical): No  Physical Activity:  Sufficiently Active  . Days of Exercise per Week: 3 days  . Minutes of Exercise per Session: 60 min  Stress: No Stress Concern Present  . Feeling of Stress : Not at all  Social Connections: Not on file     Family History: The patient's family history includes Hypertension in her mother.  ROS:   Please see the history of present illness.    (+) LE edema All other systems reviewed and are negative.  EKGs/Labs/Other Studies Reviewed:    EKG:   01/26/2021: EKG is not ordered today. 11/25/2020(ED visit): NSR, rate 74  Recent Labs: 08/17/2020: ALT 13 11/25/2020: BUN 8; Creatinine, Ser 0.75; Hemoglobin 12.4; Platelets 180; Potassium 4.4; Sodium 135   Recent Lipid Panel    Component Value Date/Time   CHOL 220 (H) 08/17/2020 1708   TRIG 103 08/17/2020 1708   HDL 55 08/17/2020 1708   CHOLHDL 4.0 08/17/2020 1708   LDLCALC 147 (H) 08/17/2020 1708    Physical Exam:   VS:  BP (!) 238/90   Pulse 72   Ht 5\' 7"  (1.702 m)   Wt 153 lb 3.2 oz (69.5 kg)   SpO2 98%   BMI 23.99 kg/m  , BMI Body mass index is 23.99 kg/m. GENERAL:  Well appearing HEENT: Pupils equal round and reactive, fundi not visualized, oral mucosa unremarkable NECK:  No jugular venous distention, waveform within normal limits, carotid upstroke brisk and symmetric, no bruits LUNGS:  Clear to auscultation bilaterally HEART:  RRR.  PMI not displaced or sustained,S1 and S2 within normal limits, no S3, no S4, no clicks, no rubs, no murmurs ABD:  Flat, positive bowel sounds normal in frequency in pitch, no bruits, no rebound, no guarding, no midline pulsatile mass, no hepatomegaly, no splenomegaly EXT:  2 plus pulses throughout, no edema, no cyanosis no clubbing SKIN:  No rashes no nodules NEURO:  Cranial nerves II through XII grossly intact, motor grossly intact throughout PSYCH:  Cognitively intact, oriented to person place and time   ASSESSMENT/PLAN:    Essential hypertension Blood pressure is very poorly  controlled.  It is extremely elevated here but there is a degree of whitecoat hypertension superimposed on chronic hypertension.  We discussed switching to an alternative beta-blocker but she has also been intolerant to carvedilol and bisoprolol.  Continue metoprolol for now.  We will add spironolactone 12.5 mg daily and check a basic metabolic panel in a week.  If her potassium is stable we will increase to 25 mg.  Continue with exercising and to work on limiting her sodium intake.  Could consider indapamide or doxazosin in the future.  She brought her blood pressure machine to the office and it was accurate.  It runs much higher in the office and has been much better controlled at home.   Screening for Secondary Hypertension:  Causes 01/26/2021  Drugs/Herbals Screened     - Comments mild caffeine  intake  Renovascular HTN Not Screened  Sleep Apnea Screened     - Comments no snoring  Hyperthyroidism Screened     - Comments check TSH  Hypothyroidism Screened    Relevant Labs/Studies: Basic Labs Latest Ref Rng & Units 11/25/2020 09/14/2020 08/17/2020  Sodium 135 - 145 mmol/L 135 135 139  Potassium 3.5 - 5.1 mmol/L 4.4 4.2 4.5  Creatinine 0.44 - 1.00 mg/dL 0.75 0.76 0.74    Thyroid  Latest Ref Rng & Units 04/16/2019 07/25/2017  TSH 0.450 - 4.500 uIU/mL 1.940 1.500                 Disposition:    FU with Paula Chamberlin C. Oval Linsey, MD, [facility] 4 months.  PharmD in 1 month.   Medication Adjustments/Labs and Tests Ordered: Current medicines are reviewed at length with the patient today.  Concerns regarding medicines are outlined above.  Orders Placed This Encounter  Procedures  . Basic metabolic panel   Meds ordered this encounter  Medications  . spironolactone (ALDACTONE) 25 MG tablet    Sig: TAKE 1/2 TABLET DAILY    Dispense:  45 tablet    Refill:  3   I,Mathew Stumpf,acting as a scribe for Skeet Latch, MD.,have documented all relevant documentation on the behalf of Skeet Latch,  MD,as directed by  Skeet Latch, MD while in the presence of Skeet Latch, MD.  I, [place] Oval Linsey, MD have reviewed all documentation for this visit.  The documentation of the exam, diagnosis, procedures, and orders on 01/26/2021 are all accurate and complete.   Signed, Skeet Latch, MD  01/26/2021 12:23 PM    [facility]

## 2021-01-26 NOTE — Patient Instructions (Addendum)
Medication Instructions:  START SPIRONOLACTONE 25 MG 1/2 TABLET DAILY    Labwork: BMET IN 1 WEEK    Testing/Procedures: NONE   Follow-Up: 03/01/2021 AT 3:00 PM WITH PHARM D    Special Instructions:   MONITOR YOUR BLOOD PRESSURE TWICE A DAY, LOG IN THE BOOK PROVIDED. BRING THE BOOK AND YOUR BLOOD PRESSURE MACHINE TO YOUR FOLLOW UP IN 1 MONTH   DASH Eating Plan DASH stands for "Dietary Approaches to Stop Hypertension." The DASH eating plan is a healthy eating plan that has been shown to reduce high blood pressure (hypertension). It may also reduce your risk for type 2 diabetes, heart disease, and stroke. The DASH eating plan may also help with weight loss. What are tips for following this plan?  General guidelines  Avoid eating more than 2,300 mg (milligrams) of salt (sodium) a day. If you have hypertension, you may need to reduce your sodium intake to 1,500 mg a day.  Limit alcohol intake to no more than 1 drink a day for nonpregnant women and 2 drinks a day for men. One drink equals 12 oz of beer, 5 oz of wine, or 1 oz of hard liquor.  Work with your health care provider to maintain a healthy body weight or to lose weight. Ask what an ideal weight is for you.  Get at least 30 minutes of exercise that causes your heart to beat faster (aerobic exercise) most days of the week. Activities may include walking, swimming, or biking.  Work with your health care provider or diet and nutrition specialist (dietitian) to adjust your eating plan to your individual calorie needs. Reading food labels   Check food labels for the amount of sodium per serving. Choose foods with less than 5 percent of the Daily Value of sodium. Generally, foods with less than 300 mg of sodium per serving fit into this eating plan.  To find whole grains, look for the word "whole" as the first word in the ingredient list. Shopping  Buy products labeled as "low-sodium" or "no salt added."  Buy fresh foods. Avoid  canned foods and premade or frozen meals. Cooking  Avoid adding salt when cooking. Use salt-free seasonings or herbs instead of table salt or sea salt. Check with your health care provider or pharmacist before using salt substitutes.  Do not fry foods. Cook foods using healthy methods such as baking, boiling, grilling, and broiling instead.  Cook with heart-healthy oils, such as olive, canola, soybean, or sunflower oil. Meal planning  Eat a balanced diet that includes: ? 5 or more servings of fruits and vegetables each day. At each meal, try to fill half of your plate with fruits and vegetables. ? Up to 6-8 servings of whole grains each day. ? Less than 6 oz of lean meat, poultry, or fish each day. A 3-oz serving of meat is about the same size as a deck of cards. One egg equals 1 oz. ? 2 servings of low-fat dairy each day. ? A serving of nuts, seeds, or beans 5 times each week. ? Heart-healthy fats. Healthy fats called Omega-3 fatty acids are found in foods such as flaxseeds and coldwater fish, like sardines, salmon, and mackerel.  Limit how much you eat of the following: ? Canned or prepackaged foods. ? Food that is high in trans fat, such as fried foods. ? Food that is high in saturated fat, such as fatty meat. ? Sweets, desserts, sugary drinks, and other foods with added sugar. ? Full-fat dairy  products.  Do not salt foods before eating.  Try to eat at least 2 vegetarian meals each week.  Eat more home-cooked food and less restaurant, buffet, and fast food.  When eating at a restaurant, ask that your food be prepared with less salt or no salt, if possible. What foods are recommended? The items listed may not be a complete list. Talk with your dietitian about what dietary choices are best for you. Grains Whole-grain or whole-wheat bread. Whole-grain or whole-wheat pasta. Brown rice. [place] Morrow. Bulgur. Whole-grain and low-sodium cereals. Pita bread. Low-fat, low-sodium  crackers. Whole-wheat flour tortillas. Vegetables Fresh or frozen vegetables (raw, steamed, roasted, or grilled). Low-sodium or reduced-sodium tomato and vegetable juice. Low-sodium or reduced-sodium tomato sauce and tomato paste. Low-sodium or reduced-sodium canned vegetables. Fruits All fresh, dried, or frozen fruit. Canned fruit in natural juice (without added sugar). Meat and other protein foods Skinless chicken or [place]. Ground chicken or [place]. Pork with fat trimmed off. Fish and seafood. Egg whites. Dried beans, peas, or lentils. Unsalted nuts, nut butters, and seeds. Unsalted canned beans. Lean cuts of beef with fat trimmed off. Low-sodium, lean deli meat. Dairy Low-fat (1%) or fat-free (skim) milk. Fat-free, low-fat, or reduced-fat cheeses. Nonfat, low-sodium ricotta or cottage cheese. Low-fat or nonfat yogurt. Low-fat, low-sodium cheese. Fats and oils Soft margarine without trans fats. Vegetable oil. Low-fat, reduced-fat, or light mayonnaise and salad dressings (reduced-sodium). Canola, safflower, olive, soybean, and sunflower oils. Avocado. Seasoning and other foods Herbs. Spices. Seasoning mixes without salt. Unsalted popcorn and pretzels. Fat-free sweets. What foods are not recommended? The items listed may not be a complete list. Talk with your dietitian about what dietary choices are best for you. Grains Baked goods made with fat, such as croissants, muffins, or some breads. Dry pasta or rice meal packs. Vegetables Creamed or fried vegetables. Vegetables in a cheese sauce. Regular canned vegetables (not low-sodium or reduced-sodium). Regular canned tomato sauce and paste (not low-sodium or reduced-sodium). Regular tomato and vegetable juice (not low-sodium or reduced-sodium). Angie Fava. Olives. Fruits Canned fruit in a light or heavy syrup. Fried fruit. Fruit in cream or butter sauce. Meat and other protein foods Fatty cuts of meat. Ribs. Fried meat. Berniece Salines. Sausage. Bologna and  other processed lunch meats. Salami. Fatback. Hotdogs. Bratwurst. Salted nuts and seeds. Canned beans with added salt. Canned or smoked fish. Whole eggs or egg yolks. Chicken or [place] with skin. Dairy Whole or 2% milk, cream, and half-and-half. Whole or full-fat cream cheese. Whole-fat or sweetened yogurt. Full-fat cheese. Nondairy creamers. Whipped toppings. Processed cheese and cheese spreads. Fats and oils Butter. Stick margarine. Lard. Shortening. Ghee. Bacon fat. Tropical oils, such as coconut, palm kernel, or palm oil. Seasoning and other foods Salted popcorn and pretzels. Onion salt, garlic salt, seasoned salt, table salt, and sea salt. Worcestershire sauce. Tartar sauce. Barbecue sauce. Teriyaki sauce. Soy sauce, including reduced-sodium. Steak sauce. Canned and packaged gravies. Fish sauce. Oyster sauce. Cocktail sauce. Horseradish that you find on the shelf. Ketchup. Mustard. Meat flavorings and tenderizers. Bouillon cubes. Hot sauce and Tabasco sauce. Premade or packaged marinades. Premade or packaged taco seasonings. Relishes. Regular salad dressings. Where to find more information:  National Heart, Lung, and [place]: https://wilson-eaton.com/  American Heart Association: www.heart.org Summary  The DASH eating plan is a healthy eating plan that has been shown to reduce high blood pressure (hypertension). It may also reduce your risk for type 2 diabetes, heart disease, and stroke.  With the DASH eating plan, you should limit  salt (sodium) intake to 2,300 mg a day. If you have hypertension, you may need to reduce your sodium intake to 1,500 mg a day.  When on the DASH eating plan, aim to eat more fresh fruits and vegetables, whole grains, lean proteins, low-fat dairy, and heart-healthy fats.  Work with your health care provider or diet and nutrition specialist (dietitian) to adjust your eating plan to your individual calorie needs. This information is not intended to replace advice  given to you by your health care provider. Make sure you discuss any questions you have with your health care provider. Document Released: 08/24/2011 Document Revised: 08/17/2017 Document Reviewed: 08/28/2016 Elsevier Patient Education  2020 [employer].

## 2021-01-26 NOTE — Assessment & Plan Note (Addendum)
Blood pressure is very poorly controlled.  It is extremely elevated here but there is a degree of whitecoat hypertension superimposed on chronic hypertension.  We discussed switching to an alternative beta-blocker but she has also been intolerant to carvedilol and bisoprolol.  Continue metoprolol for now.  We will add spironolactone 12.5 mg daily and check a basic metabolic panel in a week.  If her potassium is stable we will increase to 25 mg.  Continue with exercising and to work on limiting her sodium intake.  Could consider indapamide or doxazosin in the future.  She brought her blood pressure machine to the office and it was accurate.  It runs much higher in the office and has been much better controlled at home.

## 2021-02-03 DIAGNOSIS — I10 Essential (primary) hypertension: Secondary | ICD-10-CM | POA: Diagnosis not present

## 2021-02-03 DIAGNOSIS — Z5181 Encounter for therapeutic drug level monitoring: Secondary | ICD-10-CM | POA: Diagnosis not present

## 2021-02-04 LAB — BASIC METABOLIC PANEL
BUN/Creatinine Ratio: 11 — ABNORMAL LOW (ref 12–28)
BUN: 9 mg/dL (ref 8–27)
CO2: 21 mmol/L (ref 20–29)
Calcium: 9.8 mg/dL (ref 8.7–10.3)
Chloride: 99 mmol/L (ref 96–106)
Creatinine, Ser: 0.84 mg/dL (ref 0.57–1.00)
Glucose: 104 mg/dL — ABNORMAL HIGH (ref 65–99)
Potassium: 4.2 mmol/L (ref 3.5–5.2)
Sodium: 136 mmol/L (ref 134–144)
eGFR: 77 mL/min/{1.73_m2} (ref >59)

## 2021-03-01 ENCOUNTER — Ambulatory Visit (INDEPENDENT_AMBULATORY_CARE_PROVIDER_SITE_OTHER): Payer: Medicare HMO | Admitting: Pharmacist

## 2021-03-01 ENCOUNTER — Other Ambulatory Visit: Payer: Self-pay

## 2021-03-01 VITALS — BP 192/78 | HR 75 | Resp 17 | Ht 67.0 in | Wt 153.4 lb

## 2021-03-01 DIAGNOSIS — I10 Essential (primary) hypertension: Secondary | ICD-10-CM

## 2021-03-01 DIAGNOSIS — Z79899 Other long term (current) drug therapy: Secondary | ICD-10-CM | POA: Diagnosis not present

## 2021-03-01 MED ORDER — SPIRONOLACTONE 25 MG PO TABS: 25.0000 mg | ORAL_TABLET | Freq: Every day | ORAL | 1 refills | Status: DC

## 2021-03-01 NOTE — Progress Notes (Signed)
Patient ID: Paula Ali                 DOB: 11/25/1955                      MRN: [id number]     HPI: Paula Ali is a 65 y.o. female referred by Dr. Oval Linsey to HTN clinic. Working since 2019 with her PCP and clinical pharmacist at PCP team to manage blood pressure. She reports intolerance to 9 different BP medication, but is unable to recall reaction to bisoprolol. Also discussed recurrent symptom of tingly/burning lips. This is a recurrent issues lasting for few years and has unknown trigger. Patient also takes different supplements and vitamins, but unable to recall all names and doses (none in medication list). Noted ginko and ginsen mentioned during visit with Dr Oval Linsey and both supplements can raise blood pressure.  Current HTN meds:  Spironolactone 12.5mg  daily - started by Dr Oval Linsey on 01/26/2021 Metoprolol succinate 50mg  daily  Previously tried:  Clonidine patch - blurry vision Losartan/HCTZ - hyponatremia HCTZ - hyponatremia Hydralazine - tingling/burning/headaches Amlodipine - LE edema and acid reflux Lisinopril - lower lip swelling Nifedipine 30mg  XR - chest heaviness Carvedilol - acid reflux Bisoprolol - no ADR  BP goal: 130/80 (140/90 if unable to tolerate lower goal)  Family History: family history includes hypertension in her mother and son.  Social History: denies alcohol and tobacco use  Diet:  Breakfast: yogurt, toast, jelly, breakfast bar, occasional eggs No lunch and no snacks  Dinner: 50/50 take out and home cooked meals, chick fill a,   Exercise: walks for 45-50 minutes plus weights (3-5 per week)  Home BP readings: No home BP cuff available today to calibrate  10 morning measurement, average 160/77, HR range 61-75bpm 9 evening measurement, average 159/69, HR range 60-72 bpm  Wt Readings from Last 3 Encounters:  03/01/21 153 lb 6.4 oz (69.6 kg)  01/26/21 153 lb 3.2 oz (69.5 kg)  11/25/20 150 lb (68 kg)   BP Readings  from Last 3 Encounters:  03/01/21 (!) 192/78  01/26/21 (!) 238/90  11/25/20 (!) 169/66   Pulse Readings from Last 3 Encounters:  03/01/21 75  01/26/21 72  11/25/20 65    Past Medical History:  Diagnosis Date   Hypertension     Current Outpatient Medications on File Prior to Visit  Medication Sig Dispense Refill   metoprolol succinate (TOPROL-XL) 50 MG 24 hr tablet Take 1 tablet (50 mg total) by mouth daily. 90 tablet 0   No current facility-administered medications on file prior to visit.    Allergies  Allergen Reactions   Lisinopril Swelling and Other (See Comments)    Lower lip swelling   Amlodipine Besylate Other (See Comments)    ACID REFLUX   Amoxicillin    Clonidine     BLURRED VISION    Hydralazine Hcl     Side effects. 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 rate 17, height 5\' 7"  (1.702 m), weight 153 lb 6.4 oz (69.6 kg), SpO2 98 %.  Essential hypertension Blood pressure remains elevated today, but slightly improved since taking spironolactone 12.5mg  daily. Patient denied problems with current medication, and repeat BMET showed stable renal function and electrolytes.  Patient reports taking various supplements, vitamins, and teas, but is unable to recall every name or strength/doses.  I suspect a degree of "white coat syndrome", plus adverse reaction to one or more of her supplements causing resistant hypertension.   Will increase spironolactone to 25mg  daily, repeat BMET in 2 weeks, and follow up in 4-5 weeks. Patient was instructed to bring her home BP device, plus ALL supplements, vitamins, and teas she is currently taking to next follow up visit.   Lark Langenfeld Rodriguez-Guzman PharmD, BCPS, [place] [id number] 03/03/2021 4:29 PM

## 2021-03-01 NOTE — Patient Instructions (Addendum)
Return for a follow up appointment in 4-5 weeks  Check your blood pressure at home daily (if able) and keep record of the readings.  Blood work: 2 weeks from dose increase  Take your BP meds as follows: *INCREASE spironolactone to 25mg  daily (1 full tablet)  Bring all of your meds, your BP cuff and your record of home blood pressures to your next appointment.  Exercise as you're able, try to walk approximately 30 minutes per day.  Keep salt intake to a minimum, especially watch canned and prepared boxed foods.  Eat more fresh fruits and vegetables and fewer canned items.  Avoid eating in fast food restaurants.    HOW TO TAKE YOUR BLOOD PRESSURE: Rest 5 minutes before taking your blood pressure.  Don't smoke or drink caffeinated beverages for at least 30 minutes before. Take your blood pressure before (not after) you eat. Sit comfortably with your back supported and both feet on the floor (don't cross your legs). Elevate your arm to heart level on a table or a desk. Use the proper sized cuff. It should fit smoothly and snugly around your bare upper arm. There should be enough room to slip a fingertip under the cuff. The bottom edge of the cuff should be 1 inch above the crease of the elbow. Ideally, take 3 measurements at one sitting and record the average.

## 2021-03-03 NOTE — Assessment & Plan Note (Signed)
Blood pressure remains elevated today, but slightly improved since taking spironolactone 12.5mg  daily. Patient denied problems with current medication, and repeat BMET showed stable renal function and electrolytes.  Patient reports taking various supplements, vitamins, and teas, but is unable to recall every name or strength/doses. I suspect a degree of "white coat syndrome", plus adverse reaction to one or more of her supplements causing resistant hypertension.   Will increase spironolactone to 25mg  daily, repeat BMET in 2 weeks, and follow up in 4-5 weeks. Patient was instructed to bring her home BP device, plus ALL supplements, vitamins, and teas she is currently taking to next follow up visit.

## 2021-03-16 ENCOUNTER — Encounter: Payer: Self-pay | Admitting: Nurse Practitioner

## 2021-03-16 ENCOUNTER — Other Ambulatory Visit: Payer: Self-pay | Admitting: Physician Assistant

## 2021-03-16 DIAGNOSIS — I10 Essential (primary) hypertension: Secondary | ICD-10-CM | POA: Diagnosis not present

## 2021-03-16 DIAGNOSIS — Z79899 Other long term (current) drug therapy: Secondary | ICD-10-CM | POA: Diagnosis not present

## 2021-03-16 MED ORDER — METOPROLOL SUCCINATE ER 50 MG PO TB24: 50.0000 mg | ORAL_TABLET | Freq: Every day | ORAL | 0 refills | Status: DC

## 2021-03-17 LAB — BASIC METABOLIC PANEL
BUN/Creatinine Ratio: 15 (ref 12–28)
BUN: 11 mg/dL (ref 8–27)
CO2: 20 mmol/L (ref 20–29)
Calcium: 9.8 mg/dL (ref 8.7–10.3)
Chloride: 100 mmol/L (ref 96–106)
Creatinine, Ser: 0.75 mg/dL (ref 0.57–1.00)
Glucose: 92 mg/dL (ref 65–99)
Potassium: 4.9 mmol/L (ref 3.5–5.2)
Sodium: 139 mmol/L (ref 134–144)
eGFR: 88 mL/min/{1.73_m2} (ref >59)

## 2021-04-05 ENCOUNTER — Ambulatory Visit: Payer: Medicare HMO

## 2021-04-14 ENCOUNTER — Other Ambulatory Visit: Payer: Self-pay | Admitting: Nurse Practitioner

## 2021-04-14 ENCOUNTER — Other Ambulatory Visit: Payer: Self-pay

## 2021-04-14 ENCOUNTER — Ambulatory Visit: Payer: Medicare HMO | Admitting: Pharmacist Clinician (PhC)/ Clinical Pharmacy Specialist

## 2021-04-14 DIAGNOSIS — I10 Essential (primary) hypertension: Secondary | ICD-10-CM | POA: Diagnosis not present

## 2021-04-14 DIAGNOSIS — Z1231 Encounter for screening mammogram for malignant neoplasm of breast: Secondary | ICD-10-CM

## 2021-04-14 NOTE — Assessment & Plan Note (Signed)
Patient with essential hypertension as well as white coat.  Home BP reading average is 50 points lower than in office reading, verified on both home meter and in office readings today.  Had long discussion about herbal products and their impact on blood pressure, as well as purity differences between brands/lots due to lack of regulation.  Advised that she should stop the ginkgo, ginseng and evening primrose for the next month, to determine impact of these on her pressure.  Patient agreeable to this.  Also reviewed that if these are not raising her pressure, we will need to look at increasing spironolactone to 37.5 mg or adding in another agent.  Would consider candesartan 8 mg as a good place to start.

## 2021-04-14 NOTE — Progress Notes (Signed)
Patient ID: [name]                 DOB: Dec 06, 1955                      MRN: [id number]     HPI: Paula Ali is a 65 y.o. female referred by Dr. Oval Linsey to HTN clinic. Working since 2019 with her PCP and clinical pharmacist at PCP team to manage blood pressure. She reports intolerance to 9 different BP medication, but is unable to recall reaction to bisoprolol. Also discussed recurrent symptom of tingly/burning lips. This is a recurrent issues lasting for few years and has unknown trigger. Patient also takes different supplements and vitamins, but unable to recall all names and doses (none in medication list). Noted ginko and ginsen mentioned during visit with Dr Oval Linsey and both supplements can raise blood pressure.  Today patient returns for follow up.  She brought all of her supplements with her and they were added to her med list.  She states she doesn't take each one every day, but takes them randomly, sometimes daily, sometimes every 2-3 days.  In reviewing the list, ginkgo, ginseng and evening primrose are all products that can raise BP.   She states teas are mostly Lipton, but does sometimes drink tea with threonine.    Current HTN meds:  Spironolactone 25 mg daily - started by Dr Oval Linsey on 01/26/2021 Metoprolol succinate '50mg'$  daily  Previously tried:  Clonidine patch - blurry vision Losartan/HCTZ - hyponatremia HCTZ - hyponatremia Hydralazine - tingling/burning/headaches Amlodipine - LE edema and acid reflux Lisinopril - lower lip swelling Nifedipine '30mg'$  XR - chest heaviness Carvedilol - acid reflux Bisoprolol - no ADR  BP goal: 130/80 (140/90 if unable to tolerate lower goal)  Family History: family history includes hypertension in her mother and son.  Social History: denies alcohol and tobacco use  Diet: Does rinse canned vegetables, doesn't add salt, but doesn't otherwise monitor sodium content of foods.  Breakfast: yogurt, toast, jelly,  breakfast bar, occasional eggs No lunch and no snacks  Dinner: 50/50 take out and home cooked meals, chick [employer] a  Exercise: walks for 45-50 minutes plus weights (3-5 per week)  Home BP readings: Home meter, Omron arm cuff, read within 2/10 points of office reading   13 morning readings, average 145/77, down from 160/77   11 evening readings average 147/70, down from 159/69  Wt Readings from Last 3 Encounters:  04/14/21 155 lb 3.2 oz (70.4 kg)  03/01/21 153 lb 6.4 oz (69.6 kg)  01/26/21 153 lb 3.2 oz (69.5 kg)   BP Readings from Last 3 Encounters:  04/14/21 (!) 198/96  03/01/21 (!) 192/78  01/26/21 (!) 238/90   Pulse Readings from Last 3 Encounters:  04/14/21 77  03/01/21 75  01/26/21 72    Past Medical History:  Diagnosis Date   Hypertension     Current Outpatient Medications on File Prior to Visit  Medication Sig Dispense Refill   B Complex Vitamins (B-COMPLEX/B-12 PO) Take by mouth.     BL EVENING PRIMROSE OIL PO Take by mouth.     BLACK CURRANT SEED OIL PO Take by mouth.     cholecalciferol (VITAMIN D3) 25 MCG (1000 UNIT) tablet Take 1,000 Units by mouth 5 (five) times daily. Take 5 tablets daily     Cinnamon 500 MG capsule Take 500 mg by mouth daily.     Coenzyme Q10 (CO Q-10) 100 MG CAPS Take  by mouth.     Ginger 500 MG CAPS Take by mouth.     Ginkgo Biloba 120 MG CAPS Take by mouth.     Ginseng 100 MG CAPS Take 2 capsules by mouth daily.     metoprolol succinate (TOPROL-XL) 50 MG 24 hr tablet Take 1 tablet (50 mg total) by mouth daily. 90 tablet 0   Misc Natural Products (DAILY HERBS BREAST HEALTH PO) Take by mouth.     Misc Natural Products (LEG VEIN & CIRCULATION PO) Take by mouth.     Multiple Vitamins-Minerals (WOMENS ONE DAILY PO) Take by mouth.     PHOSPHATIDYL CHOLINE PO Take by mouth.     spironolactone (ALDACTONE) 25 MG tablet Take 1 tablet (25 mg total) by mouth daily. 90 tablet 1   Theanine 200 MG CAPS Take by mouth.     Turmeric (QC TUMERIC COMPLEX)  500 MG CAPS Take by mouth.     No current facility-administered medications on file prior to visit.    Allergies  Allergen Reactions   Lisinopril Swelling and Other (See Comments)    Lower lip swelling   Amlodipine Besylate Other (See Comments)    ACID REFLUX   Amoxicillin    Clonidine     BLURRED VISION    Hydralazine Hcl     Side effects. 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 rate 17, height '5\' 7"'$  (1.702 m), weight 155 lb 3.2 oz (70.4 kg), SpO2 98 %.  Essential hypertension Patient with essential hypertension as well as white coat.  Home BP reading average is 50 points lower than in office reading, verified on both home meter and in office readings today.  Had long discussion about herbal products and their impact on blood pressure, as well as purity differences between brands/lots due to lack of regulation.  Advised that she should stop the ginkgo, ginseng and evening primrose for the next month, to determine impact of these on her pressure.  Patient agreeable to this.  Also reviewed that if these are not raising her pressure, we will need to look at increasing spironolactone to 37.5 mg or adding in another agent.  Would consider candesartan 8 mg as a good place to start.     Tommy Medal PharmD CPP [facility] [address] [address] 04/14/2021 11:34 AM

## 2021-04-14 NOTE — Patient Instructions (Signed)
Return for a a follow up appointment Tuesday Sept 6 at 10 am  Check your blood pressure at home daily and keep record of the readings.  Take your BP meds as follows:  Stop taking ginseng, ginkgo and evening primrose.    Continue with spironolactone and metoprolol.   Bring all of your meds, your BP cuff and your record of home blood pressures to your next appointment.  Exercise as you're able, try to walk approximately 30 minutes per day.  Keep salt intake to a minimum, especially watch canned and prepared boxed foods.  Eat more fresh fruits and vegetables and fewer canned items.  Avoid eating in fast food restaurants.    HOW TO TAKE YOUR BLOOD PRESSURE: Rest 5 minutes before taking your blood pressure.  Don't smoke or drink caffeinated beverages for at least 30 minutes before. Take your blood pressure before (not after) you eat. Sit comfortably with your back supported and both feet on the floor (don't cross your legs). Elevate your arm to heart level on a table or a desk. Use the proper sized cuff. It should fit smoothly and snugly around your bare upper arm. There should be enough room to slip a fingertip under the cuff. The bottom edge of the cuff should be 1 inch above the crease of the elbow. Ideally, take 3 measurements at one sitting and record the average.

## 2021-05-05 ENCOUNTER — Telehealth: Payer: Self-pay

## 2021-05-05 NOTE — Telephone Encounter (Signed)
Pt returned my call and we were able to get  her rescheduled

## 2021-05-05 NOTE — Telephone Encounter (Signed)
Lmom to r/s as we will be short staffed a pharmd that day

## 2021-05-14 ENCOUNTER — Telehealth: Payer: Self-pay

## 2021-05-14 NOTE — Telephone Encounter (Signed)
Called pt to schedule AWV, no answer, left vm to call back. 

## 2021-05-24 ENCOUNTER — Ambulatory Visit: Payer: Medicare HMO

## 2021-05-25 ENCOUNTER — Ambulatory Visit: Payer: Medicare HMO | Admitting: Pharmacist Clinician (PhC)/ Clinical Pharmacy Specialist

## 2021-05-25 ENCOUNTER — Encounter: Payer: Self-pay | Admitting: Pharmacist Clinician (PhC)/ Clinical Pharmacy Specialist

## 2021-05-25 ENCOUNTER — Other Ambulatory Visit: Payer: Self-pay

## 2021-05-25 VITALS — BP 216/92 | HR 66 | Resp 16 | Ht 67.0 in | Wt 153.2 lb

## 2021-05-25 DIAGNOSIS — I10 Essential (primary) hypertension: Secondary | ICD-10-CM

## 2021-05-25 MED ORDER — CANDESARTAN CILEXETIL 4 MG PO TABS: 4.0000 mg | ORAL_TABLET | Freq: Every day | ORAL | 6 refills | Status: DC

## 2021-05-25 NOTE — Progress Notes (Signed)
Patient ID: Paula Ali                 DOB: 1956-08-15                      MRN: [id number]     HPI: Paula Ali is a 65 y.o. female referred by Dr. Oval Linsey to HTN clinic. Working since 2019 with her PCP and clinical pharmacist at PCP team to manage blood pressure. She reports intolerance to 9 different BP medication, but is unable to recall reaction to bisoprolol. Also discussed recurrent symptom of tingly/burning lips. This is a recurrent issues lasting for few years and has unknown trigger. Patient also takes different supplements and vitamins, but unable to recall all names and doses (none in medication list). Noted ginko and ginsen mentioned during visit with Dr Oval Linsey and both supplements can raise blood pressure.  At her last visit with [facility] she noted that she takes her supplements "randomly", so was asked to hold off completely on the gingko, ginseng and evening primrose until follow up.    Today she returns for follow up.  Her home pressures only decreased a few points by discontinuing the herbal supplements, but she was encouraged to stay off.  She is concerned with the idea of needing a third medication, as she prefers to just take her supplements.  Her reading is quite elevated in the office today, however we know that she has some measure of white coat hypertension.  Home readings average in the [id number] systolic range (see below).    Current HTN meds:  Spironolactone 25 mg daily - started by Dr Oval Linsey on 01/26/2021 Metoprolol succinate '50mg'$  daily  Previously tried:  Clonidine patch - blurry vision Losartan/HCTZ - hyponatremia HCTZ - hyponatremia Hydralazine - tingling/burning/headaches Amlodipine - LE edema and acid reflux Lisinopril - lower lip swelling Nifedipine '30mg'$  XR - chest heaviness Carvedilol - acid reflux Bisoprolol - no ADR  BP goal: 130/80 (140/90 if unable to tolerate lower goal)  Family History: family history includes hypertension in  her mother and son.  Social History: denies alcohol and tobacco use  Diet: Does rinse canned vegetables, doesn't add salt, but doesn't otherwise monitor sodium content of foods.  Breakfast: yogurt, toast, jelly, breakfast bar, occasional eggs No lunch and no snacks  Dinner: 50/50 take out and home cooked meals, [employer]  Exercise: walks for 45-50 minutes plus weights (3-5 per week); no exercise recently  Home BP readings: Home meter, Omron arm cuff, read within 2/10 points of office reading   14 morning readings, average 143/72, previously 145/77, 160/77 (last 2 visits)  11 evening readings average 145//68, previously 147/70, 159/69  (last 2 visits)  Wt Readings from Last 3 Encounters:  05/25/21 153 lb 3.2 oz (69.5 kg)  04/14/21 155 lb 3.2 oz (70.4 kg)  03/01/21 153 lb 6.4 oz (69.6 kg)   BP Readings from Last 3 Encounters:  05/25/21 (!) 216/92  04/14/21 (!) 198/96  03/01/21 (!) 192/78   Pulse Readings from Last 3 Encounters:  05/25/21 66  04/14/21 77  03/01/21 75    Past Medical History:  Diagnosis Date   Hypertension     Current Outpatient Medications on File Prior to Visit  Medication Sig Dispense Refill   B Complex Vitamins (B-COMPLEX/B-12 PO) Take by mouth.     cholecalciferol (VITAMIN D3) 25 MCG (1000 UNIT) tablet Take 1,000 Units by mouth 5 (five) times daily. Take 5 tablets daily  Cinnamon 500 MG capsule Take 500 mg by mouth daily.     Coenzyme Q10 (CO Q-10) 100 MG CAPS Take by mouth.     Ginger 500 MG CAPS Take by mouth.     metoprolol succinate (TOPROL-XL) 50 MG 24 hr tablet Take 1 tablet (50 mg total) by mouth daily. 90 tablet 0   Misc Natural Products (DAILY HERBS BREAST HEALTH PO) Take by mouth.     Misc Natural Products (LEG VEIN & CIRCULATION PO) Take by mouth.     Multiple Vitamins-Minerals (WOMENS ONE DAILY PO) Take by mouth.     PHOSPHATIDYL CHOLINE PO Take by mouth.     spironolactone (ALDACTONE) 25 MG tablet Take 1 tablet (25 mg total) by mouth  daily. 90 tablet 1   Theanine 200 MG CAPS Take by mouth.     Turmeric 500 MG CAPS Take by mouth.     BL EVENING PRIMROSE OIL PO Take by mouth. (Patient not taking: Reported on 05/25/2021)     BLACK CURRANT SEED OIL PO Take by mouth. (Patient not taking: Reported on 05/25/2021)     Ginkgo Biloba 120 MG CAPS Take by mouth. (Patient not taking: Reported on 05/25/2021)     Ginseng 100 MG CAPS Take 2 capsules by mouth daily. (Patient not taking: Reported on 05/25/2021)     No current facility-administered medications on file prior to visit.    Allergies  Allergen Reactions   Lisinopril Swelling and Other (See Comments)    Lower lip swelling   Amlodipine Besylate Other (See Comments)    ACID REFLUX   Amoxicillin    Clonidine     BLURRED VISION    Hydralazine Hcl     Side effects. 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 rate 16, height '5\' 7"'$  (1.702 m), weight 153 lb 3.2 oz (69.5 kg), SpO2 99 %.  Essential hypertension Patient with hard to treat hypertension, in part due to difficulty with medication tolerances.  She is currently tolerating metoprolol and spironolactone.  Discussed the need to have one more medication on board, as she is not close enough to goal at this time.  Discussed options of candesartan, minoxidil or doxazosin.  Reviewed potential benefit, side effects and expected drop in BP.  Patient agreeable to start candesartan, but worries about risk for hyponatremia.  Will start her on candesartan 4 mg (with the understanding that she will most likely need 8 mg or more to lower BP significantly) and repeat metabolic panel in 14 days.  She will return in 6 weeks for follow up.    Tommy Medal PharmD CPP [facility] [address] [place] [address] 05/26/2021 12:31 PM

## 2021-05-25 NOTE — Patient Instructions (Signed)
Return for a a follow up appointment October 18 at 1:30 pm  Go to the lab in 10-14 days to check sodium and potassium levels.  If you have questions or concerns please call Shakeyla Giebler/Chris at [phone]  Check your blood pressure at home daily (if able) and keep record of the readings.  Take your BP meds as follows:  Start candesartan 4 mg once daily.  Continue with all other medications  Bring all of your meds, your BP cuff and your record of home blood pressures to your next appointment.  Exercise as you're able, try to walk approximately 30 minutes per day.  Keep salt intake to a minimum, especially watch canned and prepared boxed foods.  Eat more fresh fruits and vegetables and fewer canned items.  Avoid eating in fast food restaurants.    HOW TO TAKE YOUR BLOOD PRESSURE: Rest 5 minutes before taking your blood pressure.  Don't smoke or drink caffeinated beverages for at least 30 minutes before. Take your blood pressure before (not after) you eat. Sit comfortably with your back supported and both feet on the floor (don't cross your legs). Elevate your arm to heart level on a table or a desk. Use the proper sized cuff. It should fit smoothly and snugly around your bare upper arm. There should be enough room to slip a fingertip under the cuff. The bottom edge of the cuff should be 1 inch above the crease of the elbow. Ideally, take 3 measurements at one sitting and record the average.

## 2021-05-26 ENCOUNTER — Encounter: Payer: Self-pay | Admitting: Pharmacist Clinician (PhC)/ Clinical Pharmacy Specialist

## 2021-05-26 NOTE — Assessment & Plan Note (Signed)
Patient with hard to treat hypertension, in part due to difficulty with medication tolerances.  She is currently tolerating metoprolol and spironolactone.  Discussed the need to have one more medication on board, as she is not close enough to goal at this time.  Discussed options of candesartan, minoxidil or doxazosin.  Reviewed potential benefit, side effects and expected drop in BP.  Patient agreeable to start candesartan, but worries about risk for hyponatremia.  Will start her on candesartan 4 mg (with the understanding that she will most likely need 8 mg or more to lower BP significantly) and repeat metabolic panel in 14 days.  She will return in 6 weeks for follow up.

## 2021-05-29 NOTE — Telephone Encounter (Signed)
Tried again, no answer, LVMTRC. 

## 2021-06-02 ENCOUNTER — Ambulatory Visit: Payer: Medicare HMO

## 2021-06-14 DIAGNOSIS — I10 Essential (primary) hypertension: Secondary | ICD-10-CM | POA: Diagnosis not present

## 2021-06-15 LAB — BASIC METABOLIC PANEL
BUN/Creatinine Ratio: 13 (ref 12–28)
BUN: 10 mg/dL (ref 8–27)
CO2: 21 mmol/L (ref 20–29)
Calcium: 10.1 mg/dL (ref 8.7–10.3)
Chloride: 99 mmol/L (ref 96–106)
Creatinine, Ser: 0.79 mg/dL (ref 0.57–1.00)
Glucose: 89 mg/dL (ref 70–99)
Potassium: 5.1 mmol/L (ref 3.5–5.2)
Sodium: 134 mmol/L (ref 134–144)
eGFR: 82 mL/min/{1.73_m2} (ref >59)

## 2021-06-20 ENCOUNTER — Telehealth: Payer: Self-pay

## 2021-06-20 NOTE — Telephone Encounter (Signed)
Lmom to r/s pharmd appt

## 2021-06-21 ENCOUNTER — Other Ambulatory Visit: Payer: Self-pay | Admitting: Physician Assistant

## 2021-06-21 DIAGNOSIS — I10 Essential (primary) hypertension: Secondary | ICD-10-CM

## 2021-06-21 MED ORDER — METOPROLOL SUCCINATE ER 50 MG PO TB24: 50.0000 mg | ORAL_TABLET | Freq: Every day | ORAL | 0 refills | Status: DC

## 2021-06-22 ENCOUNTER — Other Ambulatory Visit: Payer: Self-pay | Admitting: Nurse Practitioner

## 2021-06-22 DIAGNOSIS — Z1231 Encounter for screening mammogram for malignant neoplasm of breast: Secondary | ICD-10-CM

## 2021-07-01 ENCOUNTER — Ambulatory Visit: Payer: Medicare HMO

## 2021-07-05 ENCOUNTER — Ambulatory Visit: Payer: Medicare HMO

## 2021-07-12 ENCOUNTER — Ambulatory Visit: Payer: Medicare HMO

## 2021-07-20 ENCOUNTER — Ambulatory Visit: Payer: Medicare HMO

## 2021-07-28 ENCOUNTER — Other Ambulatory Visit: Payer: Medicare HMO

## 2021-08-04 ENCOUNTER — Other Ambulatory Visit: Payer: Self-pay

## 2021-08-04 ENCOUNTER — Ambulatory Visit
Admission: RE | Admit: 2021-08-04 | Discharge: 2021-08-04 | Disposition: A | Discharge status: Home / Self Care | Payer: Medicare HMO | Source: Ambulatory Visit | Attending: Nurse Practitioner | Admitting: Nurse Practitioner

## 2021-08-04 DIAGNOSIS — M8588 Other specified disorders of bone density and structure, other site: Secondary | ICD-10-CM | POA: Diagnosis not present

## 2021-08-04 DIAGNOSIS — Z780 Asymptomatic menopausal state: Secondary | ICD-10-CM | POA: Diagnosis not present

## 2021-08-04 DIAGNOSIS — M810 Age-related osteoporosis without current pathological fracture: Secondary | ICD-10-CM | POA: Diagnosis not present

## 2021-08-09 ENCOUNTER — Encounter: Payer: Self-pay | Admitting: Nurse Practitioner

## 2021-08-09 ENCOUNTER — Other Ambulatory Visit: Payer: Self-pay

## 2021-08-09 ENCOUNTER — Ambulatory Visit: Payer: Medicare HMO | Attending: Nurse Practitioner | Admitting: Nurse Practitioner

## 2021-08-09 DIAGNOSIS — Z1211 Encounter for screening for malignant neoplasm of colon: Secondary | ICD-10-CM | POA: Diagnosis not present

## 2021-08-09 DIAGNOSIS — Z1159 Encounter for screening for other viral diseases: Secondary | ICD-10-CM | POA: Diagnosis not present

## 2021-08-09 DIAGNOSIS — E785 Hyperlipidemia, unspecified: Secondary | ICD-10-CM

## 2021-08-09 DIAGNOSIS — M810 Age-related osteoporosis without current pathological fracture: Secondary | ICD-10-CM | POA: Diagnosis not present

## 2021-08-09 NOTE — Progress Notes (Signed)
Virtual Visit via Telephone Note Due to national recommendations of social distancing due to [place] 19, telehealth visit is felt to be most appropriate for this patient at this time.  I discussed the limitations, risks, security and privacy concerns of performing an evaluation and management service by telephone and the availability of in person appointments. I also discussed with the patient that there may be a patient responsible charge related to this service. The patient expressed understanding and agreed to proceed.    I connected with Paula Ali on 08/09/21  at   2:50 PM EST  EDT by telephone and verified that I am speaking with the correct person using two identifiers.  Location of Patient: Private Residence   Location of Provider: [facility] [employer] Office    Persons participating in Telemedicine visit: Geryl Rankins FNP-BC Ashante Yellin    History of Present Illness: Telemedicine visit for: Osteoporosis  Verified Via Bone Density on 07-05-2021 Patient denies history of fracture.The cause of osteoporosis is felt to be due to postmenopausal estrogen deficiency.   She is currently being treated with calcium and vitamin D supplementation.  She is not currently being treated with bisphosphonates  Osteoporosis Risk Factors  Nonmodifiable Personal Hx of fracture as an adult: no Hx of fracture in first-degree relative: no Caucasian race: yes Advanced age: yes Female sex: yes Dementia: no Poor health/frailty: no   Potentially modifiable: Tobacco use: no Low body weight (<127 lbs): no Estrogen deficiency    early menopause (age <45) or bilateral ovariectomy: no    prolonged premenopausal amenorrhea (>1 yr): no Low calcium intake ( (taking less than 1200 mg per day) Alcoholism: no Recurrent falls: no Inadequate physical activity: no  Current calcium and Vit D intake: Dietary sources: Dairy Supplements: Vitamin D 5000 units daily.  Calcium 500mg  daily     Past Medical History:  Diagnosis Date   Hypertension     Past Surgical History:  Procedure Laterality Date   TONSILLECTOMY      Family History  Problem Relation Age of Onset   Hypertension Mother     Social History   Socioeconomic History   Marital status: Married    Spouse name: Not on file   Number of children: Not on file   Years of education: Not on file   Highest education level: Not on file  Occupational History   Not on file  Tobacco Use   Smoking status: Never   Smokeless tobacco: Never  Vaping Use   Vaping Use: Never used  Substance and Sexual Activity   Alcohol use: No   Drug use: No   Sexual activity: Not on file  Other Topics Concern   Not on file  Social History Narrative   Not on file   Social Determinants of Health   Financial Resource Strain: Low Risk    Difficulty of Paying Living Expenses: Not hard at all  Food Insecurity: No Food Insecurity   Worried About Charity fundraiser in the Last Year: Never true   [place] in the Last Year: Never true  Transportation Needs: No Transportation Needs   Lack of Transportation (Medical): No   Lack of Transportation (Non-Medical): No  Physical Activity: Sufficiently Active   Days of Exercise per Week: 3 days   Minutes of Exercise per Session: 60 min  Stress: No Stress Concern Present   Feeling of Stress : Not at all  Social Connections: Not on file  Observations/Objective: Awake, alert and oriented x 3   Review of Systems  Constitutional:  Negative for fever, malaise/fatigue and weight loss.  HENT: Negative.  Negative for nosebleeds.   Eyes: Negative.  Negative for blurred vision, double vision and photophobia.  Respiratory: Negative.  Negative for cough and shortness of breath.   Cardiovascular: Negative.  Negative for chest pain, palpitations and leg swelling.  Gastrointestinal: Negative.  Negative for heartburn, nausea and vomiting.  Musculoskeletal:  Negative.  Negative for myalgias.  Neurological: Negative.  Negative for dizziness, focal weakness, seizures and headaches.  Psychiatric/Behavioral: Negative.  Negative for suicidal ideas.    Assessment and Plan: Diagnoses and all orders for this visit:  Age-related osteoporosis without current pathological fracture -     VITAMIN D 25 Hydroxy (Vit-D Deficiency, Fractures); Future CONSERVATIVE MANAGEMENT AT THIS TIME CALCIUM 1200 MG DAILY VITAMIN D 1000 MG DAILY   Need for hepatitis C screening test -     HCV Ab w Reflex to Quant PCR; Future  Colon cancer screening -     Fecal occult blood, imunochemical([facility]); Future  Dyslipidemia, goal LDL below 100 -     Lipid panel; Future    Follow Up Instructions Return in about 3 months (around 11/09/2021).     I discussed the assessment and treatment plan with the patient. The patient was provided an opportunity to ask questions and all were answered. The patient agreed with the plan and demonstrated an understanding of the instructions.   The patient was advised to call back or seek an in-person evaluation if the symptoms worsen or if the condition fails to improve as anticipated.  I provided 10 minutes of non-face-to-face time during this encounter including median intraservice time, reviewing previous notes, labs, imaging, medications and explaining diagnosis and management.  Gildardo Pounds, FNP-BC

## 2021-09-15 ENCOUNTER — Other Ambulatory Visit: Payer: Self-pay | Admitting: Cardiovascular Disease

## 2021-09-20 ENCOUNTER — Ambulatory Visit: Payer: Medicare HMO | Attending: Nurse Practitioner

## 2021-09-20 ENCOUNTER — Other Ambulatory Visit: Payer: Self-pay

## 2021-09-20 DIAGNOSIS — M810 Age-related osteoporosis without current pathological fracture: Secondary | ICD-10-CM | POA: Diagnosis not present

## 2021-09-21 LAB — VITAMIN D 25 HYDROXY (VIT D DEFICIENCY, FRACTURES): Vit D, 25-Hydroxy: 65.1 ng/mL (ref 30.0–100.0)

## 2021-09-22 DIAGNOSIS — Z1211 Encounter for screening for malignant neoplasm of colon: Secondary | ICD-10-CM | POA: Diagnosis not present

## 2021-09-26 ENCOUNTER — Other Ambulatory Visit: Payer: Self-pay | Admitting: Nurse Practitioner

## 2021-09-26 DIAGNOSIS — I10 Essential (primary) hypertension: Secondary | ICD-10-CM

## 2021-09-26 LAB — FECAL OCCULT BLOOD, IMMUNOCHEMICAL: Fecal Occult Bld: NEGATIVE

## 2021-09-26 MED ORDER — METOPROLOL SUCCINATE ER 50 MG PO TB24: 50.0000 mg | ORAL_TABLET | Freq: Every day | ORAL | 0 refills | Status: DC

## 2021-09-26 NOTE — Telephone Encounter (Signed)
Rx refilled 09/26/2021. Requested Prescriptions  Pending Prescriptions Disp Refills   metoprolol succinate (TOPROL-XL) 50 MG 24 hr tablet [Pharmacy Med Name: Metoprolol Succinate ER 50 MG Oral Tablet Extended Release 24 Hour] 90 tablet 0    Sig: Take 1 tablet by mouth once daily     Cardiovascular:  Beta Blockers Failed - 09/26/2021 11:08 AM      Failed - Last BP in normal range    BP Readings from Last 1 Encounters:  05/25/21 (!) 216/92         Failed - Valid encounter within last 6 months    Recent Outpatient Visits          1 month ago Age-related osteoporosis without current pathological fracture   [place], Vernia Buff, NP   10 months ago Essential hypertension   [place], Vernia Buff, NP   10 months ago Essential hypertension   [place], Jarome Matin, RPH-CPP   11 months ago Essential hypertension   [place], RPH-CPP   1 year ago Essential hypertension   [place], Vernia Buff, NP      Future Appointments            In 3 weeks [facility]            Passed - Last Heart Rate in normal range    Pulse Readings from Last 1 Encounters:  05/25/21 66

## 2021-10-17 ENCOUNTER — Other Ambulatory Visit: Payer: Self-pay

## 2021-10-17 ENCOUNTER — Ambulatory Visit: Payer: Medicare HMO | Admitting: Pharmacist Clinician (PhC)/ Clinical Pharmacy Specialist

## 2021-10-17 DIAGNOSIS — I10 Essential (primary) hypertension: Secondary | ICD-10-CM

## 2021-10-17 MED ORDER — SPIRONOLACTONE 25 MG PO TABS: 25.0000 mg | ORAL_TABLET | Freq: Every day | ORAL | 3 refills | Status: DC

## 2021-10-17 MED ORDER — CANDESARTAN CILEXETIL 4 MG PO TABS: 4.0000 mg | ORAL_TABLET | Freq: Every day | ORAL | 3 refills | Status: DC

## 2021-10-17 NOTE — Progress Notes (Signed)
Patient ID: Paula Ali                 DOB: 05-11-1956                      MRN: [id number]     HPI: Paula Ali is a 66 y.o. female referred by Dr. Oval Linsey to HTN clinic. Working since 2019 with her PCP and clinical pharmacist at PCP team to manage blood pressure. She reports intolerance to 9 different BP medication, but is unable to recall reaction to bisoprolol. Also discussed recurrent symptom of tingly/burning lips. This is a recurrent issues lasting for few years and has unknown trigger. Patient also takes different supplements and vitamins, but unable to recall all names and doses (none in medication list). Once a complete list was seen, we did ask that she discontinue gingko, ginseng and evening primrose, as they can increase blood  pressure.  She finally agreed to adding candesartan 4 mg daily last fall.  She seems to be tolerating this well and labs done after initiation showed no indication of hyponatremia.    She has been seen several times in clinic now, and white coat hypertension is apparent.  She has done well with the combination of candesartan, spironolactone, and metoprolol.   Because of electrolyte levels, we cannot increase spironolactone or candesartan.    Current HTN meds:  Spironolactone 25 mg daily - started by Dr Oval Linsey on 01/26/2021 Metoprolol succinate 50mg  daily Candesartan 4 mg  Previously tried:  Clonidine patch - blurry vision Losartan/HCTZ - hyponatremia HCTZ - hyponatremia Hydralazine - tingling/burning/headaches Amlodipine - LE edema and acid reflux Lisinopril - lower lip swelling Nifedipine 30mg  XR - chest heaviness Carvedilol - acid reflux Bisoprolol - no ADR  BP goal: 130/80 (140/90 if unable to tolerate lower goal)  Family History: family history includes hypertension in her mother and son.  Social History: denies alcohol and tobacco use; half caf/decaf coffee 2 cups each am, no other caffeine  Diet: doesn't add salt,  but doesn't otherwise monitor sodium content of foods.  Breakfast: yogurt, toast, jelly, breakfast bar, occasional eggs Dinner: 50/50 take out and home cooked meals,   Exercise: walks for 45-50 minutes plus weights (3-5 per week); no exercise recently  Started back to gym this week  Home BP readings: no  readings with her today  Wt Readings from Last 3 Encounters:  10/17/21 155 lb 6.4 oz (70.5 kg)  05/25/21 153 lb 3.2 oz (69.5 kg)  04/14/21 155 lb 3.2 oz (70.4 kg)   BP Readings from Last 3 Encounters:  10/17/21 (!) 182/80  05/25/21 (!) 216/92  04/14/21 (!) 198/96   Pulse Readings from Last 3 Encounters:  10/17/21 65  05/25/21 66  04/14/21 77    Past Medical History:  Diagnosis Date   Hypertension     Current Outpatient Medications on File Prior to Visit  Medication Sig Dispense Refill   B Complex Vitamins (B-COMPLEX/B-12 PO) Take by mouth.     BL EVENING PRIMROSE OIL PO Take by mouth.     BLACK CURRANT SEED OIL PO Take by mouth.     cholecalciferol (VITAMIN D3) 25 MCG (1000 UNIT) tablet Take 1,000 Units by mouth 5 (five) times daily. Take 5 tablets daily     Cinnamon 500 MG capsule Take 500 mg by mouth daily.     Coenzyme Q10 (CO Q-10) 100 MG CAPS Take by mouth.     Ginger 500 MG CAPS Take  by mouth.     metoprolol succinate (TOPROL-XL) 50 MG 24 hr tablet Take 1 tablet (50 mg total) by mouth daily. 90 tablet 0   Misc Natural Products (DAILY HERBS BREAST HEALTH PO) Take by mouth.     Misc Natural Products (LEG VEIN & CIRCULATION PO) Take by mouth.     Multiple Vitamins-Minerals (WOMENS ONE DAILY PO) Take by mouth.     Theanine 200 MG CAPS Take by mouth.     Turmeric 500 MG CAPS Take by mouth.     PHOSPHATIDYL CHOLINE PO Take by mouth.     No current facility-administered medications on file prior to visit.    Allergies  Allergen Reactions   Lisinopril Swelling and Other (See Comments)    Lower lip swelling   Amlodipine Besylate Other (See Comments)    ACID REFLUX    Amoxicillin    Clonidine     BLURRED VISION    Hydralazine Hcl     Side effects. 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 rate 14, height 5\' 7"  (1.702 m), weight 155 lb 6.4 oz (70.5 kg), SpO2 98 %.  Essential hypertension Patient with white coat hypertension superimposed on essential hypertension.  She is not willing to try any new medications at this time.  We cannot increase spironolactone due to high-normal potassium level or candesartan due to low-normal sodium.  Metoprolol is not terribly effective for BP, and increasing that dose would result in little gain.  She reports having acid reflux with carvedilol.  She will instead continue to monitor readings and be more aware of sodium in diet.  She was advised to reach out again should home BP readings increase.     Tommy Medal PharmD CPP [facility] [address] [place] [address] 10/19/2021 6:41 AM

## 2021-10-17 NOTE — Patient Instructions (Signed)
°  Check your blood pressure at home daily and keep record of the readings.  Take your BP meds as follows:  Continue with your current medications  Bring all of your meds, your BP cuff and your record of home blood pressures to your next appointment.  Exercise as youre able, try to walk approximately 30 minutes per day.  Keep salt intake to a minimum, especially watch canned and prepared boxed foods.  Eat more fresh fruits and vegetables and fewer canned items.  Avoid eating in fast food restaurants.    HOW TO TAKE YOUR BLOOD PRESSURE: Rest 5 minutes before taking your blood pressure.  Dont smoke or drink caffeinated beverages for at least 30 minutes before. Take your blood pressure before (not after) you eat. Sit comfortably with your back supported and both feet on the floor (dont cross your legs). Elevate your arm to heart level on a table or a desk. Use the proper sized cuff. It should fit smoothly and snugly around your bare upper arm. There should be enough room to slip a fingertip under the cuff. The bottom edge of the cuff should be 1 inch above the crease of the elbow. Ideally, take 3 measurements at one sitting and record the average.

## 2021-10-19 ENCOUNTER — Encounter: Payer: Self-pay | Admitting: Pharmacist Clinician (PhC)/ Clinical Pharmacy Specialist

## 2021-10-19 NOTE — Assessment & Plan Note (Signed)
Patient with white coat hypertension superimposed on essential hypertension.  She is not willing to try any new medications at this time.  We cannot increase spironolactone due to high-normal potassium level or candesartan due to low-normal sodium.  Metoprolol is not terribly effective for BP, and increasing that dose would result in little gain.  She reports having acid reflux with carvedilol.  She will instead continue to monitor readings and be more aware of sodium in diet.  She was advised to reach out again should home BP readings increase.

## 2022-01-02 ENCOUNTER — Encounter: Payer: Self-pay | Admitting: Nurse Practitioner

## 2022-01-02 ENCOUNTER — Other Ambulatory Visit: Payer: Self-pay | Admitting: Nurse Practitioner

## 2022-01-02 DIAGNOSIS — I10 Essential (primary) hypertension: Secondary | ICD-10-CM

## 2022-01-02 MED ORDER — METOPROLOL SUCCINATE ER 50 MG PO TB24: 50.0000 mg | ORAL_TABLET | Freq: Every day | ORAL | 1 refills | Status: DC

## 2022-04-10 ENCOUNTER — Other Ambulatory Visit: Payer: Self-pay | Admitting: Nurse Practitioner

## 2022-04-10 DIAGNOSIS — Z1231 Encounter for screening mammogram for malignant neoplasm of breast: Secondary | ICD-10-CM

## 2022-04-25 ENCOUNTER — Telehealth: Payer: Self-pay

## 2022-04-25 NOTE — Telephone Encounter (Signed)
Contacted pt to see if I can start her wellness visit and per pt this is not a good time husband is in the hospital. Pt states she will call the office to schedule

## 2022-05-22 IMAGING — CT CT HEAD W/O CM
4 series · 17 of 47 positions shown, 19 images · non-contrast
Comparison: None.

CLINICAL DATA: Headache.  Blurred vision on the left.

EXAM:
CT HEAD WITHOUT CONTRAST
TECHNIQUE: Contiguous axial images were obtained from the base of the skull
through the vertex without intravenous contrast.

[Series 3: head wo · axial · 0.43mm/px · z∈[-62,+52]mm · 7 of 31 slices shown, 9 images]
[im 4/31  brain]
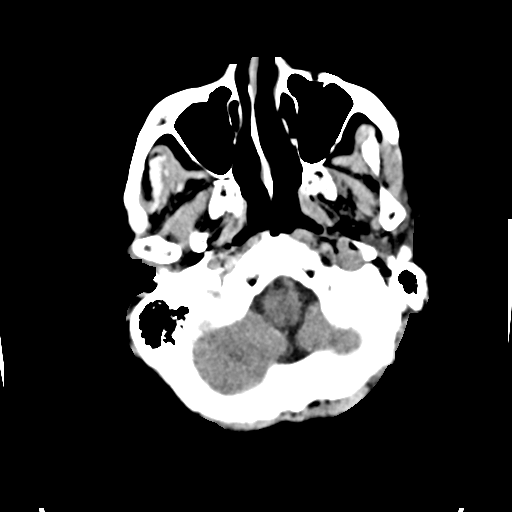
[im 4/31  bone]
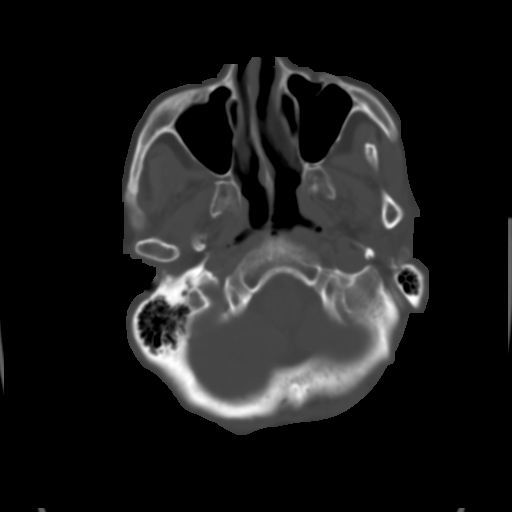
[im 8/31  brain]
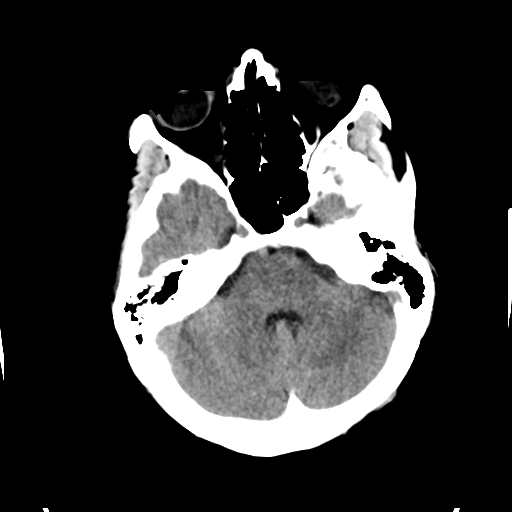
[im 12/31  brain]
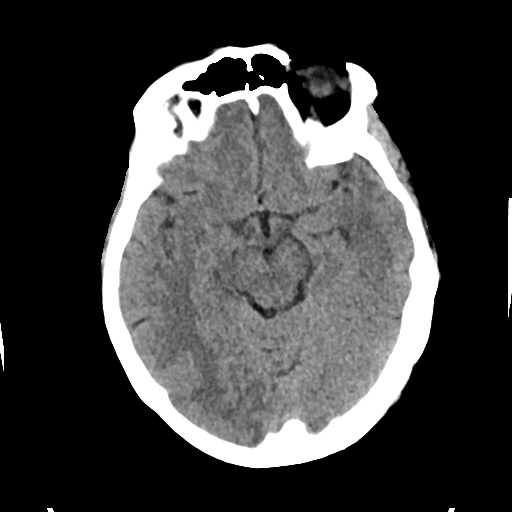
[im 16/31  brain]
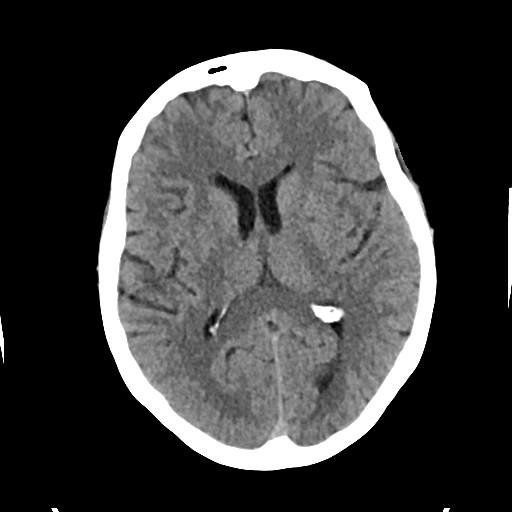
[im 19/31  brain]
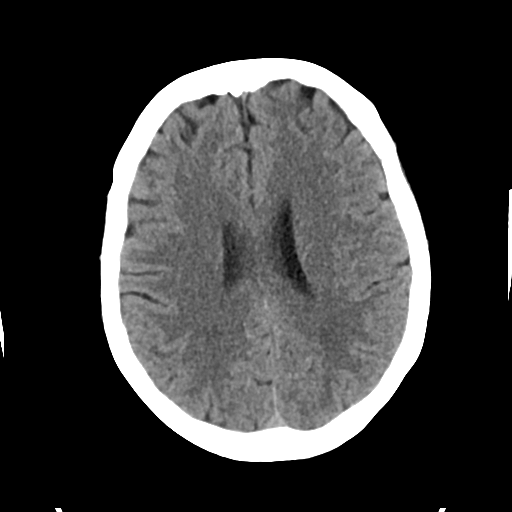
[im 19/31  bone]
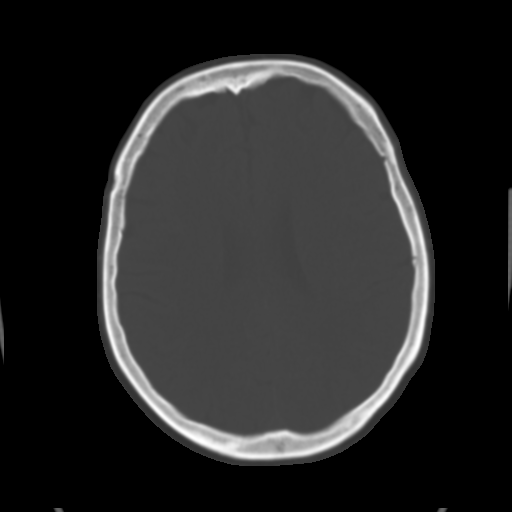
[im 23/31  brain]
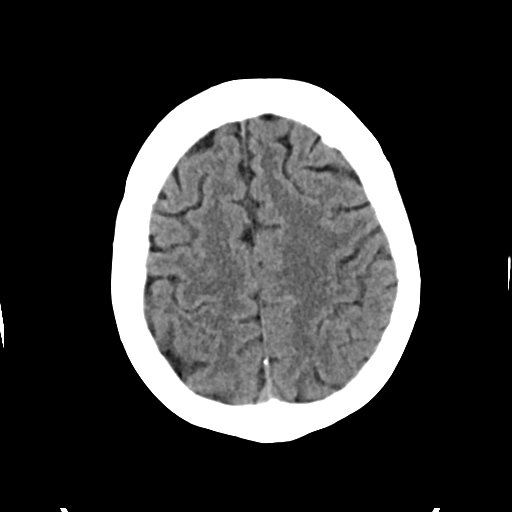
[im 27/31  brain]
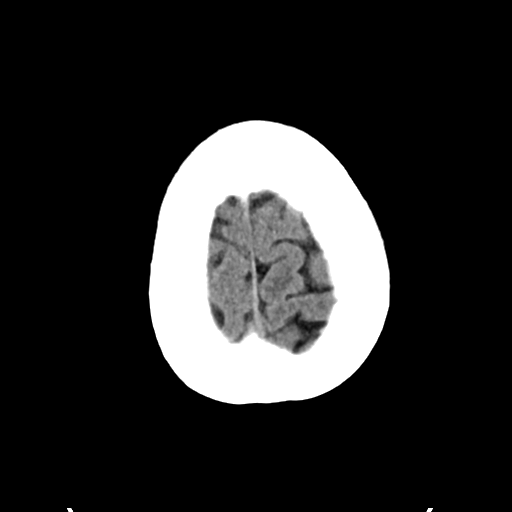

[Series 4: head bone · axial · 0.43mm/px · z∈[-64,-10]mm · 4 of 78 slices shown]
[im 8/78  bone]
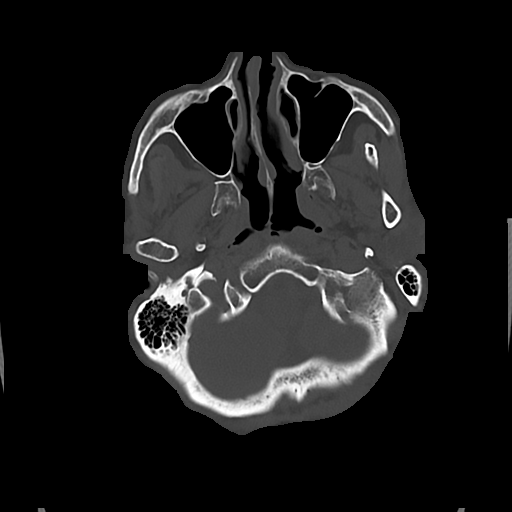
[im 16/78  bone]
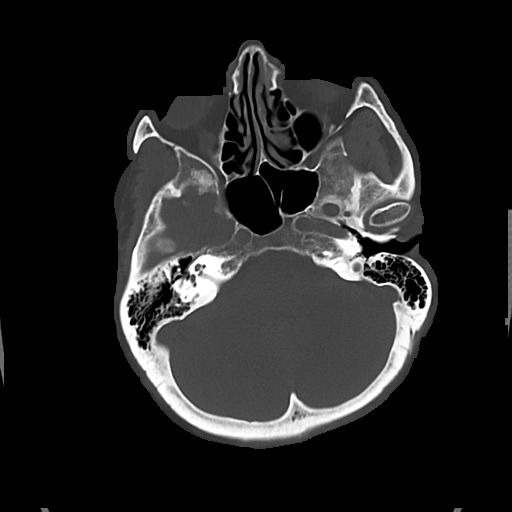
[im 24/78  bone]
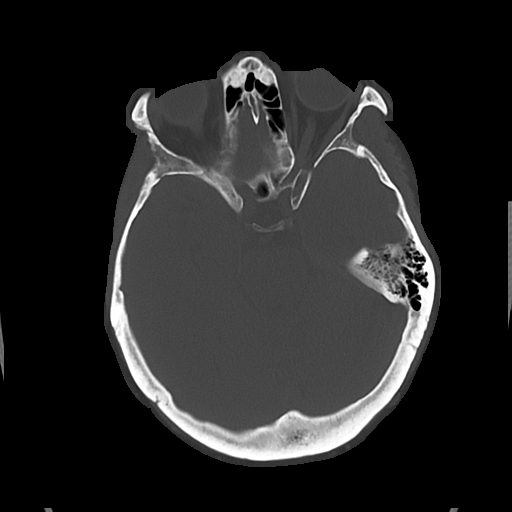
[im 35/78  bone]
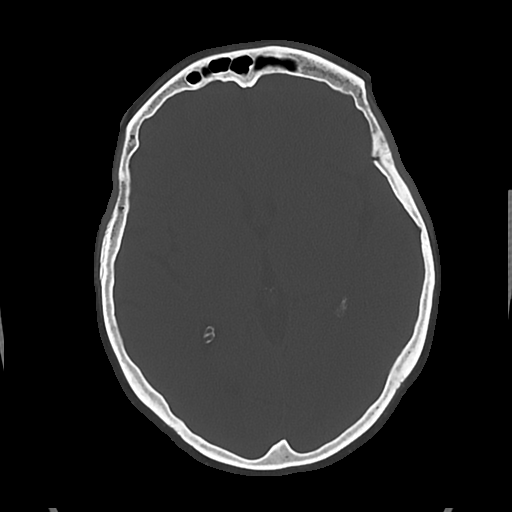

[Series 5: cor soft · coronal · 0.33mm/px · 3 of 64 slices shown]
[im 22/64  brain]
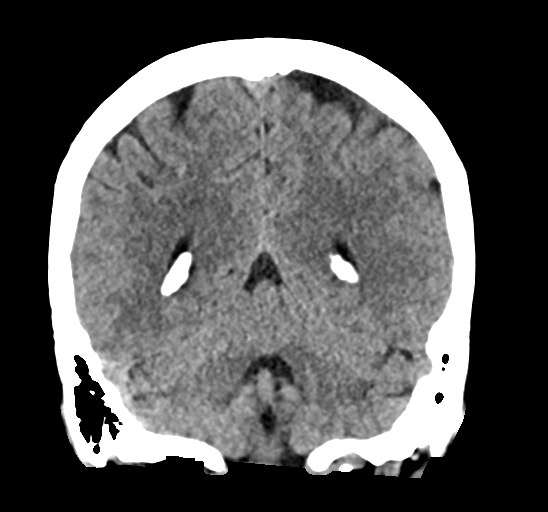
[im 29/64  brain]
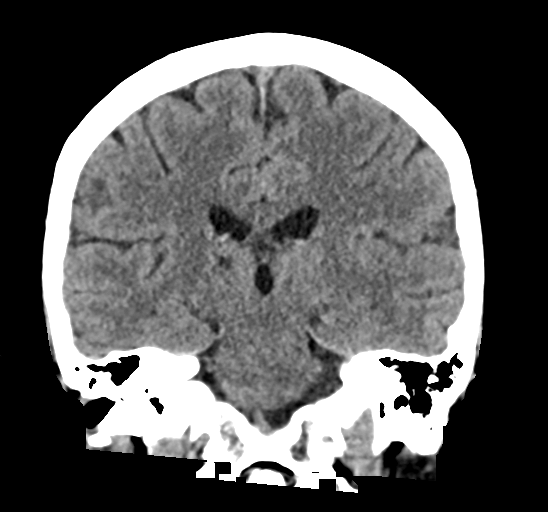
[im 36/64  brain]
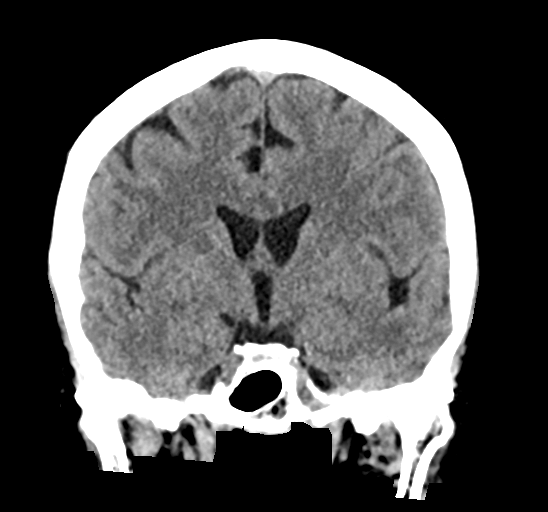

[Series 6: sag soft · sagittal · 0.33mm/px · 3 of 53 slices shown]
[im 18/53  brain]
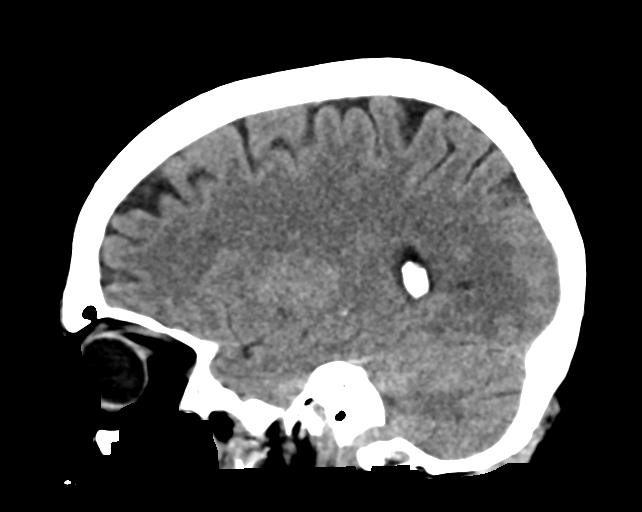
[im 27/53  brain]
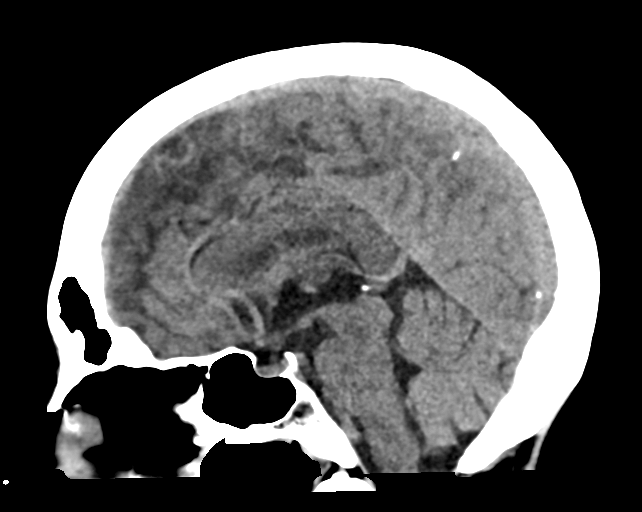
[im 35/53  brain]
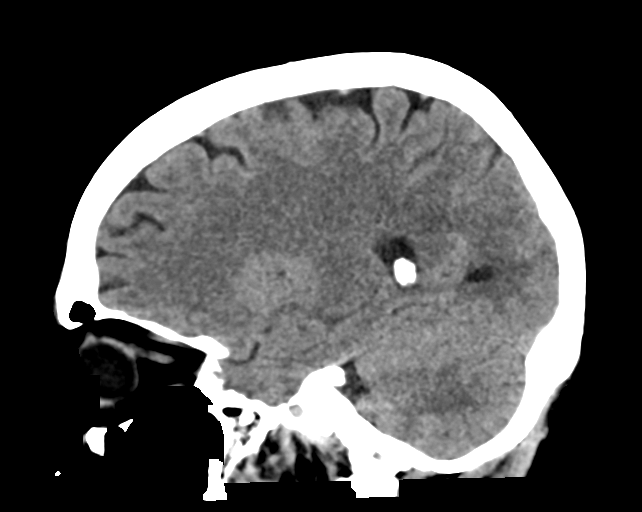

[17 of 47 positions shown; findings below may reference images not displayed]

FINDINGS: Brain: No evidence of acute infarction, hemorrhage, hydrocephalus,
extra-axial collection or mass lesion/mass effect.

Vascular: Negative for hyperdense vessel

Skull: Negative

Sinuses/Orbits: Paranasal sinuses clear.  Negative orbit

Other: None
IMPRESSION: Negative CT head

## 2022-05-26 DIAGNOSIS — H524 Presbyopia: Secondary | ICD-10-CM | POA: Diagnosis not present

## 2022-05-26 DIAGNOSIS — H52223 Regular astigmatism, bilateral: Secondary | ICD-10-CM | POA: Diagnosis not present

## 2022-06-29 ENCOUNTER — Other Ambulatory Visit: Payer: Self-pay | Admitting: Nurse Practitioner

## 2022-06-29 DIAGNOSIS — I10 Essential (primary) hypertension: Secondary | ICD-10-CM

## 2022-08-30 ENCOUNTER — Ambulatory Visit: Payer: Medicare HMO | Attending: Nurse Practitioner | Admitting: Nurse Practitioner

## 2022-08-30 ENCOUNTER — Encounter: Payer: Self-pay | Admitting: Nurse Practitioner

## 2022-08-30 VITALS — BP 192/75 | HR 83 | Wt 164.2 lb

## 2022-08-30 DIAGNOSIS — R7989 Other specified abnormal findings of blood chemistry: Secondary | ICD-10-CM

## 2022-08-30 DIAGNOSIS — I10 Essential (primary) hypertension: Secondary | ICD-10-CM | POA: Diagnosis not present

## 2022-08-30 DIAGNOSIS — L989 Disorder of the skin and subcutaneous tissue, unspecified: Secondary | ICD-10-CM

## 2022-08-30 MED ORDER — SPIRONOLACTONE 25 MG PO TABS: 25.0000 mg | ORAL_TABLET | Freq: Every day | ORAL | 3 refills | Status: DC

## 2022-08-30 MED ORDER — CANDESARTAN CILEXETIL 4 MG PO TABS: 6.0000 mg | ORAL_TABLET | Freq: Every day | ORAL | 3 refills | Status: DC

## 2022-08-30 MED ORDER — METOPROLOL SUCCINATE ER 50 MG PO TB24: 50.0000 mg | ORAL_TABLET | Freq: Every day | ORAL | 3 refills | Status: DC

## 2022-08-30 NOTE — Progress Notes (Signed)
Assessment & Plan:  Paula Ali was seen today for medication refill and hypertension.  Diagnoses and all orders for this visit:  Primary hypertension -     candesartan (ATACAND) 4 MG tablet; Take 1.5 tablets (6 mg total) by mouth daily. -     spironolactone (ALDACTONE) 25 MG tablet; Take 1 tablet (25 mg total) by mouth daily. -     metoprolol succinate (TOPROL-XL) 50 MG 24 hr tablet; Take 1 tablet (50 mg total) by mouth daily. -     CMP14+EGFR  Skin lesion -     Ambulatory referral to Dermatology  Abnormal CBC -     CBC with Differential    Patient has been counseled on age-appropriate routine health concerns for screening and prevention. These are reviewed and up-to-date. Referrals have been placed accordingly. Immunizations are up-to-date or declined.    Subjective:   Chief Complaint  Patient presents with   Medication Refill   Hypertension   Medication Refill Pertinent negatives include no chest pain, coughing, fever, headaches, myalgias, nausea or vomiting.  Hypertension Pertinent negatives include no blurred vision, chest pain, headaches, malaise/fatigue, palpitations or shortness of breath.   Paula Ali 66 y.o. female presents to office today for follow up to HTN   Blood pressure is elevated. She has her home monitor here with her today. Average: 144/70. Will increase candasartan to 6.5 mg daily and she will continue on spironolactone 78m daily and toprol XL 50 mg daily.   BP Readings from Last 3 Encounters:  08/30/22 (!) 192/75  10/17/21 (!) 182/80  05/25/21 (!) 216/92   133/68 156/69 157/76 137/68 142/72 140/74 159/72 131/67 142/71 142/70 155/78 159/67 128/62  She has a small hyperpigmented lesion on the right side at the bridge of her nose. Will refer to dermatology to evaluate for actinic keratosis vs skin cell carcinoma    Review of Systems  Constitutional:  Negative for fever, malaise/fatigue and weight loss.  HENT: Negative.   Negative for nosebleeds.   Eyes: Negative.  Negative for blurred vision, double vision and photophobia.  Respiratory: Negative.  Negative for cough and shortness of breath.   Cardiovascular: Negative.  Negative for chest pain, palpitations and leg swelling.  Gastrointestinal: Negative.  Negative for heartburn, nausea and vomiting.  Musculoskeletal: Negative.  Negative for myalgias.  Skin:        SEE HPI  Neurological: Negative.  Negative for dizziness, focal weakness, seizures and headaches.  Psychiatric/Behavioral: Negative.  Negative for suicidal ideas.     Past Medical History:  Diagnosis Date   Hypertension     Past Surgical History:  Procedure Laterality Date   TONSILLECTOMY      Family History  Problem Relation Age of Onset   Hypertension Mother     Social History Reviewed with no changes to be made today.   Outpatient Medications Prior to Visit  Medication Sig Dispense Refill   B Complex Vitamins (B-COMPLEX/B-12 PO) Take by mouth.     BL EVENING PRIMROSE OIL PO Take by mouth.     BLACK CURRANT SEED OIL PO Take by mouth.     cholecalciferol (VITAMIN D3) 25 MCG (1000 UNIT) tablet Take 1,000 Units by mouth 5 (five) times daily. Take 5 tablets daily     Cinnamon 500 MG capsule Take 500 mg by mouth daily.     Coenzyme Q10 (CO Q-10) 100 MG CAPS Take by mouth.     Ginger 500 MG CAPS Take by mouth.     Misc  Natural Products (DAILY HERBS BREAST HEALTH PO) Take by mouth.     Misc Natural Products (LEG VEIN & CIRCULATION PO) Take by mouth.     Multiple Vitamins-Minerals (WOMENS ONE DAILY PO) Take by mouth.     PHOSPHATIDYL CHOLINE PO Take by mouth.     Theanine 200 MG CAPS Take by mouth.     Turmeric 500 MG CAPS Take by mouth.     candesartan (ATACAND) 4 MG tablet Take 1 tablet (4 mg total) by mouth daily. 90 tablet 3   metoprolol succinate (TOPROL-XL) 50 MG 24 hr tablet Take 1 tablet by mouth once daily 30 tablet 0   spironolactone (ALDACTONE) 25 MG tablet Take 1 tablet (25  mg total) by mouth daily. Patient is overdue for follow up. Please call our office to schedule an appointment to ensure further refills. Attempt #1 90 tablet 3   No facility-administered medications prior to visit.    Allergies  Allergen Reactions   Lisinopril Swelling and Other (See Comments)    Lower lip swelling   Amlodipine Besylate Other (See Comments)    ACID REFLUX   Amoxicillin    Clonidine     BLURRED VISION    Hydralazine Hcl     Side effects. 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  Constitutional:      Appearance: She is well-developed.  HENT:     Head: Normocephalic and atraumatic.  Cardiovascular:     Rate and Rhythm: Normal rate and regular rhythm.     Heart sounds: Normal heart sounds. No murmur heard.    No friction rub. No gallop.  Pulmonary:     Effort: Pulmonary effort is normal. No tachypnea or respiratory distress.     Breath sounds: Normal breath sounds. No decreased breath sounds, wheezing, rhonchi or rales.  Chest:     Chest wall: No tenderness.  Abdominal:     General: Bowel sounds are normal.     Palpations: Abdomen is soft.  Musculoskeletal:        General: Normal range of motion.     Cervical back: Normal range of motion.  Skin:    General: Skin is warm and dry.  Neurological:     Mental Status: She is alert and oriented to person, place, and time.     Coordination: Coordination normal.  Psychiatric:        Behavior: Behavior normal. Behavior is cooperative.        Thought Content: Thought content normal.        Judgment: Judgment normal.           Patient has been counseled extensively about nutrition and exercise as well as the importance of adherence with medications and regular follow-up. The patient was given clear instructions to go to ER or return to medical center if symptoms don't improve, worsen or new problems develop. The patient verbalized understanding.   Follow-up: Return for 2 weeks for lab work may walk in or make  appt. See me in 6 months.Gildardo Pounds, FNP-BC [facility] [place], [place]   08/30/2022, 5:34 PM

## 2022-08-31 LAB — CBC WITH DIFFERENTIAL/PLATELET
Basophils Absolute: 0.1 10*3/uL (ref 0.0–0.2)
Basos: 1 %
EOS (ABSOLUTE): 0.2 10*3/uL (ref 0.0–0.4)
Eos: 3 %
Hematocrit: 36 % (ref 34.0–46.6)
Hemoglobin: 12.5 g/dL (ref 11.1–15.9)
Immature Grans (Abs): 0 10*3/uL (ref 0.0–0.1)
Immature Granulocytes: 1 %
Lymphocytes Absolute: 2 10*3/uL (ref 0.7–3.1)
Lymphs: 39 %
MCH: 33.2 pg — ABNORMAL HIGH (ref 26.6–33.0)
MCHC: 34.7 g/dL (ref 31.5–35.7)
MCV: 96 fL (ref 79–97)
Monocytes Absolute: 0.4 10*3/uL (ref 0.1–0.9)
Monocytes: 8 %
Neutrophils Absolute: 2.6 10*3/uL (ref 1.4–7.0)
Neutrophils: 48 %
Platelets: 219 10*3/uL (ref 150–450)
RBC: 3.77 x10E6/uL (ref 3.77–5.28)
RDW: 11.9 % (ref 11.7–15.4)
WBC: 5.3 10*3/uL (ref 3.4–10.8)

## 2022-08-31 LAB — CMP14+EGFR
ALT: 16 IU/L (ref 0–32)
AST: 15 IU/L (ref 0–40)
Albumin/Globulin Ratio: 1.6 (ref 1.2–2.2)
Albumin: 4.2 g/dL (ref 3.9–4.9)
Alkaline Phosphatase: 81 IU/L (ref 44–121)
BUN/Creatinine Ratio: 14 (ref 12–28)
BUN: 12 mg/dL (ref 8–27)
Bilirubin Total: 0.4 mg/dL (ref 0.0–1.2)
CO2: 23 mmol/L (ref 20–29)
Calcium: 9.8 mg/dL (ref 8.7–10.3)
Chloride: 99 mmol/L (ref 96–106)
Creatinine, Ser: 0.83 mg/dL (ref 0.57–1.00)
Globulin, Total: 2.6 g/dL (ref 1.5–4.5)
Glucose: 93 mg/dL (ref 70–99)
Potassium: 4.4 mmol/L (ref 3.5–5.2)
Sodium: 136 mmol/L (ref 134–144)
Total Protein: 6.8 g/dL (ref 6.0–8.5)
eGFR: 77 mL/min/{1.73_m2} (ref >59)

## 2022-10-03 ENCOUNTER — Other Ambulatory Visit: Payer: Self-pay | Admitting: Nurse Practitioner

## 2022-10-03 ENCOUNTER — Ambulatory Visit: Payer: Medicare HMO | Attending: Nurse Practitioner

## 2022-10-03 DIAGNOSIS — I10 Essential (primary) hypertension: Secondary | ICD-10-CM

## 2022-10-03 DIAGNOSIS — Z1211 Encounter for screening for malignant neoplasm of colon: Secondary | ICD-10-CM

## 2022-10-04 LAB — CMP14+EGFR
ALT: 16 IU/L (ref 0–32)
AST: 18 IU/L (ref 0–40)
Albumin/Globulin Ratio: 1.8 (ref 1.2–2.2)
Albumin: 4.4 g/dL (ref 3.9–4.9)
Alkaline Phosphatase: 86 IU/L (ref 44–121)
BUN/Creatinine Ratio: 13 (ref 12–28)
BUN: 10 mg/dL (ref 8–27)
Bilirubin Total: 0.2 mg/dL (ref 0.0–1.2)
CO2: 22 mmol/L (ref 20–29)
Calcium: 9.6 mg/dL (ref 8.7–10.3)
Chloride: 100 mmol/L (ref 96–106)
Creatinine, Ser: 0.79 mg/dL (ref 0.57–1.00)
Globulin, Total: 2.4 g/dL (ref 1.5–4.5)
Glucose: 93 mg/dL (ref 70–99)
Potassium: 4.5 mmol/L (ref 3.5–5.2)
Sodium: 135 mmol/L (ref 134–144)
Total Protein: 6.8 g/dL (ref 6.0–8.5)
eGFR: 82 mL/min/{1.73_m2} (ref >59)

## 2022-10-05 ENCOUNTER — Other Ambulatory Visit: Payer: Self-pay | Admitting: Nurse Practitioner

## 2022-10-05 DIAGNOSIS — M810 Age-related osteoporosis without current pathological fracture: Secondary | ICD-10-CM

## 2022-10-13 ENCOUNTER — Telehealth: Payer: Medicare HMO

## 2022-10-23 DIAGNOSIS — Z1211 Encounter for screening for malignant neoplasm of colon: Secondary | ICD-10-CM | POA: Diagnosis not present

## 2022-11-01 DIAGNOSIS — L821 Other seborrheic keratosis: Secondary | ICD-10-CM | POA: Diagnosis not present

## 2022-11-01 DIAGNOSIS — D171 Benign lipomatous neoplasm of skin and subcutaneous tissue of trunk: Secondary | ICD-10-CM | POA: Diagnosis not present

## 2022-11-01 DIAGNOSIS — L820 Inflamed seborrheic keratosis: Secondary | ICD-10-CM | POA: Diagnosis not present

## 2022-11-03 LAB — COLOGUARD: COLOGUARD: NEGATIVE

## 2022-11-20 ENCOUNTER — Telehealth: Payer: Self-pay | Admitting: Nurse Practitioner

## 2022-11-20 NOTE — Telephone Encounter (Signed)
Called patient to schedule Medicare Annual Wellness Visit (AWV). Left message for patient to call back and schedule Medicare Annual Wellness Visit (AWV).  Last date of AWV: due awvi 01/16/21 per [facility]   If any questions, please contact me at [phone].  Thank you ,  Barkley Boards AWV direct phone # [phone]

## 2023-01-22 ENCOUNTER — Telehealth: Payer: Self-pay | Admitting: Nurse Practitioner

## 2023-01-22 NOTE — Telephone Encounter (Signed)
Copied from CRM [id number]. Topic: Medicare AWV >> Jan 22, 2023 10:22 AM Rushie Goltz wrote: Reason for CRM: Called patient to schedule Medicare Annual Wellness Visit (AWV). Left message for patient to call back and schedule Medicare Annual Wellness Visit (AWV).  Last date of AWV: AWVI eligible as of 01/16/21  Please schedule an AWVI appointment at any time with [facility] VISIT.  If any questions, please contact me at [phone].    Thank you,  [facility] Support [facility] Direct dial  [phone]

## 2023-03-02 ENCOUNTER — Other Ambulatory Visit: Payer: Self-pay | Admitting: Nurse Practitioner

## 2023-03-02 ENCOUNTER — Ambulatory Visit: Payer: Medicare HMO | Attending: Nurse Practitioner | Admitting: Nurse Practitioner

## 2023-03-02 ENCOUNTER — Encounter: Payer: Self-pay | Admitting: Nurse Practitioner

## 2023-03-02 VITALS — BP 183/75 | HR 73 | Ht 67.0 in | Wt 168.4 lb

## 2023-03-02 DIAGNOSIS — I10 Essential (primary) hypertension: Secondary | ICD-10-CM

## 2023-03-02 DIAGNOSIS — Z1231 Encounter for screening mammogram for malignant neoplasm of breast: Secondary | ICD-10-CM

## 2023-03-02 DIAGNOSIS — M816 Localized osteoporosis [Lequesne]: Secondary | ICD-10-CM

## 2023-03-02 NOTE — Progress Notes (Signed)
Assessment & Plan:  Paula Ali was seen today for hypertension.  Diagnoses and all orders for this visit:  Essential hypertension -     CMP14+EGFR Blood pressure readings do not warrant any medication adjustments today.  Continue all antihypertensives as prescribed.  Reminded to bring in blood pressure log for follow  up appointment.  RECOMMENDATIONS: DASH/Mediterranean Diets are healthier choices for HTN.    Localized osteoporosis without current pathological fracture Bone density scheduled for December She is taking the maximum amount allowed for calcium supplement and vitamin D.  -     VITAMIN D 25 Hydroxy (Vit-D Deficiency, Fractures)    Patient has been counseled on age-appropriate routine health concerns for screening and prevention. These are reviewed and up-to-date. Referrals have been placed accordingly. Immunizations are up-to-date or declined.    Subjective:   Chief Complaint  Patient presents with   Hypertension   HPI Paula Ali 67 y.o. female presents to office today for follow up to HTN  Patient has been counseled on age-appropriate routine health concerns for screening and prevention. These are reviewed and up-to-date. Referrals have been placed accordingly. Immunizations are up-to-date or declined.     MAMMOGRAM: Scheduled for December BONE DENSITY: Scheduled for December    Home blood pressure readings as follows 146/80 171/79 127/62 138/78 148/73 143/70 147/76 135/70 134/60 146/60 131/68 147/70 143/61 140/71 Blood pressure is elevated here in office due to white coat syndrome. She has her home monitor here with her today. See above readings. She is currently taking atacand 6.5 mg daily, spironolactone 25mg  daily and toprol XL 50 mg daily.  Doing well today.  BP Readings from Last 3 Encounters:  03/02/23 (!) 183/75  08/30/22 (!) 192/75  10/17/21 (!) 182/80     Review of Systems  Constitutional:  Negative for fever,  malaise/fatigue and weight loss.  HENT: Negative.  Negative for nosebleeds.   Eyes: Negative.  Negative for blurred vision, double vision and photophobia.  Respiratory: Negative.  Negative for cough and shortness of breath.   Cardiovascular: Negative.  Negative for chest pain, palpitations and leg swelling.  Gastrointestinal: Negative.  Negative for heartburn, nausea and vomiting.  Musculoskeletal: Negative.  Negative for myalgias.  Neurological: Negative.  Negative for dizziness, focal weakness, seizures and headaches.  Psychiatric/Behavioral: Negative.  Negative for suicidal ideas.     Past Medical History:  Diagnosis Date   Hypertension     Past Surgical History:  Procedure Laterality Date   TONSILLECTOMY      Family History  Problem Relation Age of Onset   Hypertension Mother     Social History Reviewed with no changes to be made today.   Outpatient Medications Prior to Visit  Medication Sig Dispense Refill   B Complex Vitamins (B-COMPLEX/B-12 PO) Take by mouth.     BL EVENING PRIMROSE OIL PO Take by mouth.     BLACK CURRANT SEED OIL PO Take by mouth.     candesartan (ATACAND) 4 MG tablet Take 1.5 tablets (6 mg total) by mouth daily. 90 tablet 3   cholecalciferol (VITAMIN D3) 25 MCG (1000 UNIT) tablet Take 1,000 Units by mouth 5 (five) times daily. Take 5 tablets daily     Cinnamon 500 MG capsule Take 500 mg by mouth daily.     Coenzyme Q10 (CO Q-10) 100 MG CAPS Take by mouth.     Ginger 500 MG CAPS Take by mouth.     metoprolol succinate (TOPROL-XL) 50 MG 24 hr tablet Take 1 tablet (  50 mg total) by mouth daily. 90 tablet 3   Misc Natural Products (DAILY HERBS BREAST HEALTH PO) Take by mouth.     Misc Natural Products (LEG VEIN & CIRCULATION PO) Take by mouth.     Multiple Vitamins-Minerals (WOMENS ONE DAILY PO) Take by mouth.     PHOSPHATIDYL CHOLINE PO Take by mouth.     spironolactone (ALDACTONE) 25 MG tablet Take 1 tablet (25 mg total) by mouth daily. 90 tablet 3    Theanine 200 MG CAPS Take by mouth.     Turmeric 500 MG CAPS Take by mouth.     No facility-administered medications prior to visit.    Allergies  Allergen Reactions   Lisinopril Swelling and Other (See Comments)    Lower lip swelling   Amlodipine Besylate Other (See Comments)    ACID REFLUX   Amoxicillin    Clonidine     BLURRED VISION    Hydralazine Hcl     Side effects. 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  Constitutional:      Appearance: She is well-developed.  HENT:     Head: Normocephalic and atraumatic.  Cardiovascular:     Rate and Rhythm: Normal rate and regular rhythm.     Heart sounds: Normal heart sounds. No murmur heard.    No friction rub. No gallop.  Pulmonary:     Effort: Pulmonary effort is normal. No tachypnea or respiratory distress.     Breath sounds: Normal breath sounds. No decreased breath sounds, wheezing, rhonchi or rales.  Chest:     Chest wall: No tenderness.  Abdominal:     General: Bowel sounds are normal.     Palpations: Abdomen is soft.  Musculoskeletal:        General: Normal range of motion.     Cervical back: Normal range of motion.  Skin:    General: Skin is warm and dry.  Neurological:     Mental Status: She is alert and oriented to person, place, and time.     Coordination: Coordination normal.  Psychiatric:        Behavior: Behavior normal. Behavior is cooperative.        Thought Content: Thought content normal.        Judgment: Judgment normal.          Patient has been counseled extensively  about nutrition and exercise as well as the importance of adherence with medications and regular follow-up. The patient was given clear instructions to go to ER or return to medical center if symptoms don't improve, worsen or new problems develop. The patient verbalized understanding.   Follow-up: Return in about 6 months (around 09/01/2023) for PHYSICAL.   Claiborne Rigg, FNP-BC [facility] [place], [place] [phone]   03/02/2023, 2:40 PM

## 2023-03-03 LAB — CMP14+EGFR
ALT: 15 IU/L (ref 0–32)
AST: 17 IU/L (ref 0–40)
Albumin: 4.5 g/dL (ref 3.9–4.9)
Alkaline Phosphatase: 85 IU/L (ref 44–121)
BUN/Creatinine Ratio: 9 — ABNORMAL LOW (ref 12–28)
BUN: 8 mg/dL (ref 8–27)
Bilirubin Total: 0.4 mg/dL (ref 0.0–1.2)
CO2: 22 mmol/L (ref 20–29)
Calcium: 9.9 mg/dL (ref 8.7–10.3)
Chloride: 97 mmol/L (ref 96–106)
Creatinine, Ser: 0.85 mg/dL (ref 0.57–1.00)
Globulin, Total: 2.1 g/dL (ref 1.5–4.5)
Glucose: 87 mg/dL (ref 70–99)
Potassium: 4.5 mmol/L (ref 3.5–5.2)
Sodium: 132 mmol/L — ABNORMAL LOW (ref 134–144)
Total Protein: 6.6 g/dL (ref 6.0–8.5)
eGFR: 75 mL/min/{1.73_m2} (ref >59)

## 2023-03-03 LAB — VITAMIN D 25 HYDROXY (VIT D DEFICIENCY, FRACTURES): Vit D, 25-Hydroxy: 58.4 ng/mL (ref 30.0–100.0)

## 2023-03-07 ENCOUNTER — Other Ambulatory Visit: Payer: Self-pay | Admitting: Nurse Practitioner

## 2023-03-07 DIAGNOSIS — E871 Hypo-osmolality and hyponatremia: Secondary | ICD-10-CM

## 2023-04-23 ENCOUNTER — Ambulatory Visit: Payer: Medicare HMO | Attending: Nurse Practitioner

## 2023-04-23 DIAGNOSIS — E871 Hypo-osmolality and hyponatremia: Secondary | ICD-10-CM | POA: Diagnosis not present

## 2023-04-24 ENCOUNTER — Other Ambulatory Visit: Payer: Self-pay | Admitting: Nurse Practitioner

## 2023-04-24 DIAGNOSIS — E871 Hypo-osmolality and hyponatremia: Secondary | ICD-10-CM

## 2023-06-12 ENCOUNTER — Other Ambulatory Visit: Payer: Self-pay | Admitting: Nurse Practitioner

## 2023-06-12 DIAGNOSIS — I10 Essential (primary) hypertension: Secondary | ICD-10-CM

## 2023-08-24 ENCOUNTER — Encounter: Payer: Self-pay | Admitting: Nurse Practitioner

## 2023-09-04 ENCOUNTER — Ambulatory Visit: Payer: Medicare HMO

## 2023-09-11 ENCOUNTER — Ambulatory Visit: Payer: Medicare HMO | Attending: Nurse Practitioner

## 2023-09-11 VITALS — Ht 67.0 in | Wt 160.0 lb

## 2023-09-11 DIAGNOSIS — Z0000 Encounter for general adult medical examination without abnormal findings: Secondary | ICD-10-CM

## 2023-09-11 NOTE — Patient Instructions (Signed)
Paula Ali , Thank you for taking time to come for your Medicare Wellness Visit. I appreciate your ongoing commitment to your health goals. Please review the following plan we discussed and let me know if I can assist you in the future.   Referrals/Orders/Follow-Ups/Clinician Recommendations: Yes; Client understands the importance of follow-up appointments with providers by attending scheduled visits and discussed goals to eat healthier, increase physical activity 5 times a week for 30 minutes each, exercise the brain by doing stimulating brain exercises (reading, adult coloring, crafting, listening to music, puzzles, etc.), socialize and enjoy life more, get enough sleep at least 8-9 hours average per night and make time for laughter.  This is a list of the screening recommended for you and due dates:  Health Maintenance  Topic Date Due   COVID-19 Vaccine (1) Never done   Zoster (Shingles) Vaccine (1 of 2) Never done   Mammogram  07/16/2020   Flu Shot  Never done   Colon Cancer Screening  02/22/2024*   Pneumonia Vaccine (1 of 1 - PCV) 03/01/2024*   Hepatitis C Screening  03/01/2024*   Medicare Annual Wellness Visit  09/10/2024   Cologuard (Stool DNA test)  10/23/2025   DTaP/Tdap/Td vaccine (3 - Td or Tdap) 08/28/2028   DEXA scan (bone density measurement)  Completed   HPV Vaccine  Aged Out  *Topic was postponed. The date shown is not the original due date.    Advanced directives: (Declined) Advance directive discussed with you today. Even though you declined this today, please call our office should you change your mind, and we can give you the proper paperwork for you to fill out.  Next Medicare Annual Wellness Visit scheduled for next year: No

## 2023-09-11 NOTE — Progress Notes (Signed)
Subjective:   Paula Ali is a 67 y.o. female who presents for an Initial Medicare Annual Wellness Visit.  Visit Complete: Virtual I connected with  Paula Ali on 09/11/23 by a audio enabled telemedicine application and verified that I am speaking with the correct person using two identifiers.  Patient Location: Home  Provider Location: Home Office  I discussed the limitations of evaluation and management by telemedicine. The patient expressed understanding and agreed to proceed.  Vital Signs: Because this visit was a virtual/telehealth visit, some criteria may be missing or patient reported. Any vitals not documented were not able to be obtained and vitals that have been documented are patient reported.  Cardiac Risk Factors include: advanced age (>45men, >51 women);hypertension;sedentary lifestyle;family history of premature cardiovascular disease     Objective:    Today's Vitals   09/11/23 0842  Weight: 160 lb (72.6 kg)  Height: 5\' 7"  (1.702 m)  PainSc: 0-No pain   Body mass index is 25.06 kg/m.     09/11/2023    8:44 AM 11/25/2020    9:47 AM  Advanced Directives  Does Patient Have a Medical Advance Directive? No No  Would patient like information on creating a medical advance directive? No - Patient declined No - Patient declined    Current Medications (verified) Outpatient Encounter Medications as of 09/11/2023  Medication Sig   B Complex Vitamins (B-COMPLEX/B-12 PO) Take by mouth.   BL EVENING PRIMROSE OIL PO Take by mouth.   BLACK CURRANT SEED OIL PO Take by mouth.   candesartan (ATACAND) 4 MG tablet TAKE 1 & 1/2 (ONE & ONE-HALF) TABLETS BY MOUTH ONCE DAILY   cholecalciferol (VITAMIN D3) 25 MCG (1000 UNIT) tablet Take 1,000 Units by mouth 5 (five) times daily. Take 5 tablets daily   Cinnamon 500 MG capsule Take 500 mg by mouth daily.   Coenzyme Q10 (CO Q-10) 100 MG CAPS Take by mouth.   Ginger 500 MG CAPS Take by mouth.    metoprolol succinate (TOPROL-XL) 50 MG 24 hr tablet Take 1 tablet (50 mg total) by mouth daily.   Misc Natural Products (DAILY HERBS BREAST HEALTH PO) Take by mouth.   Misc Natural Products (LEG VEIN & CIRCULATION PO) Take by mouth.   Multiple Vitamins-Minerals (WOMENS ONE DAILY PO) Take by mouth.   PHOSPHATIDYL CHOLINE PO Take by mouth.   spironolactone (ALDACTONE) 25 MG tablet Take 1 tablet (25 mg total) by mouth daily.   Theanine 200 MG CAPS Take by mouth.   Turmeric 500 MG CAPS Take by mouth.   No facility-administered encounter medications on file as of 09/11/2023.    Allergies (verified) Lisinopril, Amlodipine besylate, Amoxicillin, Clonidine, Hydralazine hcl, and Losartan potassium   History: Past Medical History:  Diagnosis Date   Hypertension    Past Surgical History:  Procedure Laterality Date   TONSILLECTOMY     Family History  Problem Relation Age of Onset   Hypertension Mother    Social History   Socioeconomic History   Marital status: Married    Spouse name: Not on file   Number of children: Not on file   Years of education: Not on file   Highest education level: Not on file  Occupational History   Not on file  Tobacco Use   Smoking status: Never   Smokeless tobacco: Never  Vaping Use   Vaping status: Never Used  Substance and Sexual Activity   Alcohol use: No   Drug use: No   Sexual  activity: Not on file  Other Topics Concern   Not on file  Social History Narrative   Not on file   Social Drivers of Health   Financial Resource Strain: Low Risk  (09/11/2023)   Overall Financial Resource Strain (CARDIA)    Difficulty of Paying Living Expenses: Not hard at all  Food Insecurity: No Food Insecurity (09/11/2023)   Hunger Vital Sign    Worried About Running Out of Food in the Last Year: Never true    Ran Out of Food in the Last Year: Never true  Transportation Needs: No Transportation Needs (09/11/2023)   PRAPARE - Scientist, research (physical sciences) (Medical): No    Lack of Transportation (Non-Medical): No  Physical Activity: Sufficiently Active (09/11/2023)   Exercise Vital Sign    Days of Exercise per Week: 3 days    Minutes of Exercise per Session: 60 min  Stress: No Stress Concern Present (09/11/2023)   [employer] of Occupational Health - Occupational Stress Questionnaire    Feeling of Stress : Not at all  Social Connections: Unknown (09/11/2023)   Social Connection and Isolation Panel [NHANES]    Frequency of Communication with Friends and Family: More than three times a week    Frequency of Social Gatherings with Friends and Family: More than three times a week    Attends Religious Services: Not on Marketing executive or Organizations: Yes    Attends Engineer, structural: More than 4 times per year    Marital Status: Married    Tobacco Counseling Counseling given: Not Answered   Clinical Intake:  Pre-visit preparation completed: Yes  Pain : No/denies pain Pain Score: 0-No pain     BMI - recorded: 25.06 Nutritional Status: BMI 25 -29 Overweight Nutritional Risks: None Diabetes: No  How often do you need to have someone help you when you read instructions, pamphlets, or other written materials from your doctor or pharmacy?: 1 - Never What is the last grade level you completed in school?: HSG  Interpreter Needed?: No  Information entered by :: Hodan Wurtz N. Eleri Ruben, LPN.   Activities of Daily Living    09/11/2023    8:48 AM  In your present state of health, do you have any difficulty performing the following activities:  Hearing? 0  Vision? 0  Difficulty concentrating or making decisions? 0  Walking or climbing stairs? 0  Dressing or bathing? 0  Doing errands, shopping? 0  Preparing Food and eating ? N  Using the Toilet? N  In the past six months, have you accidently leaked urine? N  Do you have problems with loss of bowel control? N  Managing your  Medications? N  Managing your Finances? N  Housekeeping or managing your Housekeeping? N    Patient Care Team: Claiborne Rigg, NP as PCP - General (Nurse Practitioner) Tobias Alexander, OD as Referring Physician (Optometry)  Indicate any recent Medical Services you may have received from other than [facility] providers in the past year (date may be approximate).     Assessment:   This is a routine wellness examination for [facility].  Hearing/Vision screen Hearing Screening - Comments:: Denies hearing difficulties; no hearing aids.  Vision Screening - Comments:: Wears rx glasses - up to date with routine eye exams with Tobias [facility] OD at [facility]    Goals Addressed               This Visit's Progress  Client understands the importance of follow-up with providers by attending scheduled visits. (pt-stated)        To get back to [employer] to exercise.      Depression Screen    09/11/2023    8:46 AM 03/02/2023    1:41 PM 08/30/2022    4:20 PM 08/17/2020    4:22 PM 04/16/2019    8:45 AM 01/17/2019    2:27 PM 08/28/2018    1:52 PM  PHQ 2/9 Scores  PHQ - 2 Score 0 0 0 0 0 0 0  PHQ- 9 Score 0 0   0 0 0    Fall Risk    09/11/2023    8:45 AM 03/02/2023    1:38 PM 08/30/2022    4:20 PM 08/17/2020    4:22 PM 01/17/2019    2:25 PM  Fall Risk   Falls in the past year? 0 0 0 0 0  Number falls in past yr: 0 0 0 0 0  Injury with Fall? 0 0 0 0 0  Risk for fall due to : No Fall Risks No Fall Risks No Fall Risks No Fall Risks   Follow up Falls prevention discussed Falls evaluation completed   Falls evaluation completed    MEDICARE RISK AT HOME: Medicare Risk at Home Any stairs in or around the home?: No If so, are there any without handrails?: No Home free of loose throw rugs in walkways, pet beds, electrical cords, etc?: Yes Adequate lighting in your home to reduce risk of falls?: Yes Life alert?: No Use of a cane, walker or w/c?: No Grab bars in the bathroom?: No Shower  chair or bench in shower?: No Elevated toilet seat or a handicapped toilet?: Yes  TIMED UP AND GO:  Was the test performed? No    Cognitive Function:    09/11/2023    8:46 AM  MMSE - Mini Mental State Exam  Not completed: Unable to complete        09/11/2023    8:46 AM  6CIT Screen  What Year? 0 points  What month? 0 points  What time? 0 points  Count back from 20 0 points  Months in reverse 0 points  Repeat phrase 0 points  Total Score 0 points    Immunizations Immunization History  Administered Date(s) Administered   Td 09/18/2000   Tdap 08/28/2018    TDAP status: Up to date  Flu Vaccine status: Declined, Education has been provided regarding the importance of this vaccine but patient still declined. Advised may receive this vaccine at local pharmacy or Health Dept. Aware to provide a copy of the vaccination record if obtained from local pharmacy or Health Dept. Verbalized acceptance and understanding.  Pneumococcal vaccine status: Declined,  Education has been provided regarding the importance of this vaccine but patient still declined. Advised may receive this vaccine at local pharmacy or Health Dept. Aware to provide a copy of the vaccination record if obtained from local pharmacy or Health Dept. Verbalized acceptance and understanding.   Covid-19 vaccine status: Declined, Education has been provided regarding the importance of this vaccine but patient still declined. Advised may receive this vaccine at local pharmacy or Health Dept.or vaccine clinic. Aware to provide a copy of the vaccination record if obtained from local pharmacy or Health Dept. Verbalized acceptance and understanding.  Qualifies for Shingles Vaccine? Yes   Zostavax completed No   Shingrix Completed?: No.    Education has been provided regarding the importance  of this vaccine. Patient has been advised to call insurance company to determine out of pocket expense if they have not yet received this  vaccine. Advised may also receive vaccine at local pharmacy or Health Dept. Verbalized acceptance and understanding.  Screening Tests Health Maintenance  Topic Date Due   COVID-19 Vaccine (1) Never done   Zoster Vaccines- Shingrix (1 of 2) Never done   MAMMOGRAM  07/16/2020   INFLUENZA VACCINE  Never done   Colonoscopy  02/22/2024 (Originally 01/26/2000)   Pneumonia Vaccine 62+ Years old (1 of 1 - PCV) 03/01/2024 (Originally 01/26/2020)   Hepatitis C Screening  03/01/2024 (Originally 01/25/1974)   Medicare Annual Wellness (AWV)  09/10/2024   Fecal DNA (Cologuard)  10/23/2025   DTaP/Tdap/Td (3 - Td or Tdap) 08/28/2028   DEXA SCAN  Completed   HPV VACCINES  Aged Out    Health Maintenance  Health Maintenance Due  Topic Date Due   COVID-19 Vaccine (1) Never done   Zoster Vaccines- Shingrix (1 of 2) Never done   MAMMOGRAM  07/16/2020   INFLUENZA VACCINE  Never done    Colorectal cancer screening: Type of screening: Cologuard. Completed 10/23/2022. Repeat every 3 years  Mammogram status: Completed 07/16/2018. Repeat every year-patient is scheduled for 09/13/2023.  Bone Density status: Completed 11/172022. Results reflect: Bone density results: OSTEOPOROSIS. Repeat every 2 years.-patient is scheduled for 09/13/2023.  Lung Cancer Screening: (Low Dose CT Chest recommended if Age 37-80 years, 20 pack-year currently smoking OR have quit w/in 15years.) does not qualify.   Lung Cancer Screening Referral: no  Additional Screening:  Hepatitis C Screening: does qualify. Patient has been postponed until 03/01/2024.  Vision Screening: Recommended annual ophthalmology exams for early detection of glaucoma and other disorders of the eye. Is the patient up to date with their annual eye exam?  Yes  Who is the provider or what is the name of the office in which the patient attends annual eye exams? Staci Palmer,OD at [facility] If pt is not established with a provider, would they like to be  referred to a provider to establish care? No .   Dental Screening: Recommended annual dental exams for proper oral hygiene  Community Resource Referral / Chronic Care Management: CRR required this visit?  No   CCM required this visit?  No     Plan:     I have personally reviewed and noted the following in the patient's chart:   Medical and social history Use of alcohol, tobacco or illicit drugs  Current medications and supplements including opioid prescriptions. Patient is not currently taking opioid prescriptions. Functional ability and status Nutritional status Physical activity Advanced directives List of other physicians Hospitalizations, surgeries, and ER visits in previous 12 months Vitals Screenings to include cognitive, depression, and falls Referrals and appointments  In addition, I have reviewed and discussed with patient certain preventive protocols, quality metrics, and best practice recommendations. A written personalized care plan for preventive services as well as general preventive health recommendations were provided to patient.     Mickeal Needy, LPN   [id number]   After Visit Summary: (MyChart) Due to this being a telephonic visit, the after visit summary with patients personalized plan was offered to patient via MyChart   Nurse Notes: None at this time.

## 2023-09-13 ENCOUNTER — Ambulatory Visit
Admission: RE | Admit: 2023-09-13 | Discharge: 2023-09-13 | Disposition: A | Discharge status: Home / Self Care | Payer: Medicare HMO | Source: Ambulatory Visit | Attending: Nurse Practitioner | Admitting: Nurse Practitioner

## 2023-09-13 DIAGNOSIS — N958 Other specified menopausal and perimenopausal disorders: Secondary | ICD-10-CM | POA: Diagnosis not present

## 2023-09-13 DIAGNOSIS — E2839 Other primary ovarian failure: Secondary | ICD-10-CM | POA: Diagnosis not present

## 2023-09-13 DIAGNOSIS — M8588 Other specified disorders of bone density and structure, other site: Secondary | ICD-10-CM | POA: Diagnosis not present

## 2023-09-13 DIAGNOSIS — M810 Age-related osteoporosis without current pathological fracture: Secondary | ICD-10-CM

## 2023-09-13 DIAGNOSIS — Z1231 Encounter for screening mammogram for malignant neoplasm of breast: Secondary | ICD-10-CM | POA: Diagnosis not present

## 2023-09-15 ENCOUNTER — Encounter: Payer: Self-pay | Admitting: Nurse Practitioner

## 2023-09-25 ENCOUNTER — Telehealth: Payer: Self-pay | Admitting: Nurse Practitioner

## 2023-09-25 NOTE — Telephone Encounter (Signed)
 Call returning call regarding bone density results.

## 2023-09-25 NOTE — Telephone Encounter (Signed)
 Pt called to go over bone density results. Pt seen Mychart message from Zelda, NP. She prefers to hold off on medications for now and will do some research on medication and if she changes her mind will call us  back. No further assistance noted.   Haze LELON Servant, NP 09/21/2023 10:13 PM EST     Despite maximizing calcium and vitamin D . Your T scores for osteoporosis have decreased and osteoporosis is still present and slightly worsened. I do recommend fosamax once a week in addition to your vitamin d  to help preserve your bone density and reduce the risk of fracture. Please let me know if you are agreeable to that  so  can send. I will also send you some information on fosamax which Is taken once a week.

## 2023-10-10 ENCOUNTER — Other Ambulatory Visit: Payer: Self-pay | Admitting: Nurse Practitioner

## 2023-10-10 DIAGNOSIS — I10 Essential (primary) hypertension: Secondary | ICD-10-CM

## 2023-10-10 NOTE — Telephone Encounter (Signed)
Requested medication (s) are due for refill today: Yes  Requested medication (s) are on the active medication list: Yes  Last refill:  08/29/22 #90, 3 refills  Future visit scheduled: No, patient notified via MyChart appointment needed  Notes to clinic:  Unable to refill per protocol, appointment needed.      Requested Prescriptions  Pending Prescriptions Disp Refills   metoprolol succinate (TOPROL-XL) 50 MG 24 hr tablet [Pharmacy Med Name: Metoprolol Succinate ER 50 MG Oral Tablet Extended Release 24 Hour] 90 tablet 0    Sig: Take 1 tablet by mouth once daily     Cardiovascular:  Beta Blockers Failed - 10/10/2023  2:56 PM      Failed - Last BP in normal range    BP Readings from Last 1 Encounters:  03/02/23 (!) 183/75         Failed - Valid encounter within last 6 months    Recent Outpatient Visits           7 months ago Essential hypertension   [facility] [place] Claiborne Rigg, NP   1 year ago Primary hypertension   [facility] [place]. [facility] Claiborne Rigg, NP   2 years ago Age-related osteoporosis without current pathological fracture   [facility] [place]. [facility] Claiborne Rigg, NP   2 years ago Essential hypertension   [facility] [place]. [facility] Claiborne Rigg, NP   2 years ago Essential hypertension   [facility] [place]. [facility], [place] L, RPH-CPP              Passed - Last Heart Rate in normal range    Pulse Readings from Last 1 Encounters:  03/02/23 73

## 2023-11-21 ENCOUNTER — Other Ambulatory Visit: Payer: Self-pay | Admitting: Nurse Practitioner

## 2023-11-21 ENCOUNTER — Other Ambulatory Visit: Payer: Self-pay | Admitting: Family Medicine

## 2023-11-21 DIAGNOSIS — I10 Essential (primary) hypertension: Secondary | ICD-10-CM

## 2023-11-21 NOTE — Telephone Encounter (Signed)
 Last Fill: 10/10/23 30 tabs/0 RF  Last OV: 03/02/23 Next OV: 12/17/23  Routing to provider for review/authorization.

## 2023-11-21 NOTE — Telephone Encounter (Signed)
 Copied from CRM [id number]. Topic: Clinical - Medication Refill >> Nov 21, 2023  2:55 PM Abundio Miu S wrote: Most Recent Primary Care Visit:  Provider: Mickeal Needy  Department: [facility]  Visit Type: MEDICARE AWV, INITIAL  Date: 09/11/2023  Medication: metoprolol succinate (TOPROL-XL) 50 MG 24 hr tablet  Has the patient contacted their pharmacy? Yes (Agent: If no, request that the patient contact the pharmacy for the refill. If patient does not wish to contact the pharmacy document the reason why and proceed with request.) (Agent: If yes, when and what did the pharmacy advise?)  Is this the correct pharmacy for this prescription? Yes If no, delete pharmacy and type the correct one.  This is the patient's preferred pharmacy:  [facility] [address], [place] - [address] [place] [address] Phone: [phone] Fax: [phone]  [facility] [address] [place] [address] Phone: [phone] Fax: [phone]  [facility] [address] [place] - [address] [place] [address] Phone: [phone] Fax: [phone]   Has the prescription been filled recently? No  Is the patient out of the medication? No  Has the patient been seen for an appointment in the last year OR does the patient have an upcoming appointment? Yes  Can we respond through MyChart? Yes  Agent: Please be advised that Rx refills may take up to 3 business days. We ask that you follow-up with your pharmacy.

## 2023-11-26 ENCOUNTER — Encounter: Payer: Self-pay | Admitting: Nurse Practitioner

## 2023-11-26 ENCOUNTER — Other Ambulatory Visit: Payer: Self-pay | Admitting: Nurse Practitioner

## 2023-11-26 DIAGNOSIS — I10 Essential (primary) hypertension: Secondary | ICD-10-CM

## 2023-11-26 MED ORDER — METOPROLOL SUCCINATE ER 50 MG PO TB24
50.0000 mg | ORAL_TABLET | Freq: Every day | ORAL | 1 refills | Status: DC
Start: 2023-11-26 — End: 2024-05-19

## 2023-11-28 ENCOUNTER — Ambulatory Visit: Attending: Nurse Practitioner

## 2023-11-28 DIAGNOSIS — E871 Hypo-osmolality and hyponatremia: Secondary | ICD-10-CM

## 2023-11-29 LAB — SODIUM: Sodium: 137 mmol/L (ref 134–144)

## 2023-12-02 ENCOUNTER — Encounter: Payer: Self-pay | Admitting: Nurse Practitioner

## 2023-12-17 ENCOUNTER — Ambulatory Visit: Attending: Nurse Practitioner | Admitting: Nurse Practitioner

## 2023-12-17 ENCOUNTER — Encounter: Payer: Self-pay | Admitting: Nurse Practitioner

## 2023-12-17 VITALS — BP 188/66 | HR 78 | Resp 20 | Ht 67.0 in | Wt 169.2 lb

## 2023-12-17 DIAGNOSIS — E559 Vitamin D deficiency, unspecified: Secondary | ICD-10-CM | POA: Diagnosis not present

## 2023-12-17 DIAGNOSIS — I10 Essential (primary) hypertension: Secondary | ICD-10-CM

## 2023-12-17 DIAGNOSIS — R7989 Other specified abnormal findings of blood chemistry: Secondary | ICD-10-CM | POA: Diagnosis not present

## 2023-12-17 MED ORDER — CANDESARTAN CILEXETIL 4 MG PO TABS: ORAL_TABLET | ORAL | 1 refills | Status: DC

## 2023-12-17 MED ORDER — SPIRONOLACTONE 25 MG PO TABS: 25.0000 mg | ORAL_TABLET | Freq: Every day | ORAL | 1 refills | Status: DC

## 2023-12-17 NOTE — Progress Notes (Signed)
 Assessment & Plan:  [name] was seen today for medical management of chronic issues.  Diagnoses and all orders for this visit:  Primary hypertension -     spironolactone (ALDACTONE) 25 MG tablet; Take 1 tablet (25 mg total) by mouth daily. -     candesartan (ATACAND) 4 MG tablet; Take 1 and 1/2 tablets by mouth once daily -     CMP14+EGFR Continue all antihypertensives as prescribed.  Reminded to send blood pressure log for next 2 weeks via mychart RECOMMENDATIONS: DASH/Mediterranean Diets are healthier choices for HTN.    Abnormal CBC -     CBC with Differential  Vitamin D deficiency disease -     VITAMIN D 25 Hydroxy (Vit-D Deficiency, Fractures)    Patient has been counseled on age-appropriate routine health concerns for screening and prevention. These are reviewed and up-to-date. Referrals have been placed accordingly. Immunizations are up-to-date or declined.    Subjective:   Chief Complaint  Patient presents with   Medical Management of Chronic Issues    Paula Ali 68 y.o. female presents to office today for follow up to HTN  She has a past medical history of Hypertension and osteoporosis    HTN States blood pressures at home are much lower. She forgot to bring her blood pressure monitoring device with her today. Home readings SBP averaging 130s. She is taking spironolactone, candesartan and metoprolol as prescribed.  BP Readings from Last 3 Encounters:  12/17/23 (!) 188/66  03/02/23 (!) 183/75  08/30/22 (!) 192/75     Review of Systems  Constitutional:  Negative for fever, malaise/fatigue and weight loss.  HENT: Negative.  Negative for nosebleeds.   Eyes: Negative.  Negative for blurred vision, double vision and photophobia.  Respiratory: Negative.  Negative for cough and shortness of breath.   Cardiovascular: Negative.  Negative for chest pain, palpitations and leg swelling.  Gastrointestinal: Negative.  Negative for heartburn, nausea and  vomiting.  Musculoskeletal: Negative.  Negative for myalgias.  Neurological: Negative.  Negative for dizziness, focal weakness, seizures and headaches.  Psychiatric/Behavioral: Negative.  Negative for suicidal ideas.     Past Medical History:  Diagnosis Date   Hypertension     Past Surgical History:  Procedure Laterality Date   TONSILLECTOMY      Family History  Problem Relation Age of Onset   Hypertension Mother    Breast cancer Neg Hx     Social History Reviewed with no changes to be made today.   Outpatient Medications Prior to Visit  Medication Sig Dispense Refill   B Complex Vitamins (B-COMPLEX/B-12 PO) Take by mouth.     BL EVENING PRIMROSE OIL PO Take by mouth.     BLACK CURRANT SEED OIL PO Take by mouth.     cholecalciferol (VITAMIN D3) 25 MCG (1000 UNIT) tablet Take 1,000 Units by mouth 5 (five) times daily. Take 5 tablets daily     Cinnamon 500 MG capsule Take 500 mg by mouth daily.     Coenzyme Q10 (CO Q-10) 100 MG CAPS Take by mouth.     Ginger 500 MG CAPS Take by mouth.     metoprolol succinate (TOPROL-XL) 50 MG 24 hr tablet Take 1 tablet (50 mg total) by mouth daily. 90 tablet 1   Misc Natural Products (DAILY HERBS BREAST HEALTH PO) Take by mouth.     Misc Natural Products (LEG VEIN & CIRCULATION PO) Take by mouth.     Multiple Vitamins-Minerals (WOMENS ONE DAILY PO) Take by  mouth.     PHOSPHATIDYL CHOLINE PO Take by mouth.     Theanine 200 MG CAPS Take by mouth.     Turmeric 500 MG CAPS Take by mouth.     candesartan (ATACAND) 4 MG tablet TAKE 1 & 1/2 (ONE & ONE-HALF) TABLETS BY MOUTH ONCE DAILY 135 tablet 0   spironolactone (ALDACTONE) 25 MG tablet Take 1 tablet (25 mg total) by mouth daily. 90 tablet 3   No facility-administered medications prior to visit.    Allergies  Allergen Reactions   Lisinopril Swelling and Other (See Comments)    Lower lip swelling   Amlodipine Besylate Other (See Comments)    ACID REFLUX   Amoxicillin    Clonidine      BLURRED VISION    Hydralazine Hcl     Side effects. 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  Constitutional:      Appearance: She is well-developed.  HENT:     Head: Normocephalic and atraumatic.  Cardiovascular:     Rate and Rhythm: Normal rate and regular rhythm.     Heart sounds: Normal heart sounds. No murmur heard.    No friction rub. No gallop.  Pulmonary:     Effort: Pulmonary effort is normal. No tachypnea or respiratory distress.     Breath sounds: Normal breath sounds. No decreased breath sounds, wheezing, rhonchi or rales.  Chest:     Chest wall: No tenderness.  Abdominal:     General: Bowel sounds are normal.     Palpations: Abdomen is soft.  Musculoskeletal:        General: Normal range of motion.     Cervical back: Normal range of motion.  Skin:    General: Skin is warm and dry.  Neurological:     Mental Status: She is alert and oriented to person, place, and time.     Coordination: Coordination normal.  Psychiatric:        Behavior: Behavior normal. Behavior is cooperative.        Thought Content: Thought content normal.        Judgment: Judgment normal.          Patient has been counseled extensively about nutrition and exercise as well as the importance of adherence with medications and regular follow-up. The patient was given clear instructions to go to ER or return to medical center if symptoms don't improve, worsen or new problems develop. The patient verbalized understanding.    Follow-up: No follow-ups on file.   Claiborne Rigg, FNP-BC [facility] [place], [place] [phone]   12/17/2023, 5:43 PM

## 2023-12-17 NOTE — Patient Instructions (Addendum)
Magnesium Taurate 400 mg daily

## 2023-12-18 LAB — CBC WITH DIFFERENTIAL/PLATELET
Basophils Absolute: 0.1 10*3/uL (ref 0.0–0.2)
Basos: 1 %
EOS (ABSOLUTE): 0.3 10*3/uL (ref 0.0–0.4)
Eos: 3 %
Hematocrit: 38.5 % (ref 34.0–46.6)
Hemoglobin: 13.2 g/dL (ref 11.1–15.9)
Immature Grans (Abs): 0 10*3/uL (ref 0.0–0.1)
Immature Granulocytes: 1 %
Lymphocytes Absolute: 2.5 10*3/uL (ref 0.7–3.1)
Lymphs: 33 %
MCH: 32.7 pg (ref 26.6–33.0)
MCHC: 34.3 g/dL (ref 31.5–35.7)
MCV: 95 fL (ref 79–97)
Monocytes Absolute: 0.6 10*3/uL (ref 0.1–0.9)
Monocytes: 8 %
Neutrophils Absolute: 3.9 10*3/uL (ref 1.4–7.0)
Neutrophils: 54 %
Platelets: 236 10*3/uL (ref 150–450)
RBC: 4.04 x10E6/uL (ref 3.77–5.28)
RDW: 11.8 % (ref 11.7–15.4)
WBC: 7.4 10*3/uL (ref 3.4–10.8)

## 2023-12-18 LAB — CMP14+EGFR
ALT: 14 IU/L (ref 0–32)
AST: 18 IU/L (ref 0–40)
Albumin: 4.4 g/dL (ref 3.9–4.9)
Alkaline Phosphatase: 96 IU/L (ref 44–121)
BUN/Creatinine Ratio: 13 (ref 12–28)
BUN: 11 mg/dL (ref 8–27)
Bilirubin Total: 0.3 mg/dL (ref 0.0–1.2)
CO2: 23 mmol/L (ref 20–29)
Calcium: 9.8 mg/dL (ref 8.7–10.3)
Chloride: 96 mmol/L (ref 96–106)
Creatinine, Ser: 0.84 mg/dL (ref 0.57–1.00)
Globulin, Total: 2.2 g/dL (ref 1.5–4.5)
Glucose: 98 mg/dL (ref 70–99)
Potassium: 5.1 mmol/L (ref 3.5–5.2)
Sodium: 133 mmol/L — ABNORMAL LOW (ref 134–144)
Total Protein: 6.6 g/dL (ref 6.0–8.5)
eGFR: 76 mL/min/{1.73_m2} (ref >59)

## 2023-12-18 LAB — VITAMIN D 25 HYDROXY (VIT D DEFICIENCY, FRACTURES): Vit D, 25-Hydroxy: 60.3 ng/mL (ref 30.0–100.0)

## 2023-12-19 ENCOUNTER — Encounter: Payer: Self-pay | Admitting: Nurse Practitioner

## 2024-01-16 DIAGNOSIS — Z0100 Encounter for examination of eyes and vision without abnormal findings: Secondary | ICD-10-CM | POA: Diagnosis not present

## 2024-03-17 ENCOUNTER — Encounter: Payer: Self-pay | Admitting: Nurse Practitioner

## 2024-03-17 ENCOUNTER — Ambulatory Visit: Attending: Nurse Practitioner | Admitting: Nurse Practitioner

## 2024-03-17 VITALS — BP 152/79 | HR 87 | Resp 19 | Ht 67.0 in | Wt 169.8 lb

## 2024-03-17 DIAGNOSIS — M810 Age-related osteoporosis without current pathological fracture: Secondary | ICD-10-CM | POA: Diagnosis not present

## 2024-03-17 DIAGNOSIS — I10 Essential (primary) hypertension: Secondary | ICD-10-CM

## 2024-03-17 NOTE — Progress Notes (Signed)
 Assessment & Plan:  Paula Ali was seen today for hypertension.  Diagnoses and all orders for this visit:  Primary hypertension -     CMP14+EGFR Continue all antihypertensives as prescribed.  Reminded to bring in blood pressure log for follow  up appointment.  RECOMMENDATIONS: DASH/Mediterranean Diets are healthier choices for HTN.    Age-related osteoporosis without current pathological fracture -     DG Bone Density; Future    Patient has been counseled on age-appropriate routine health concerns for screening and prevention. These are reviewed and up-to-date. Referrals have been placed accordingly. Immunizations are up-to-date or declined.    Subjective:   Chief Complaint  Patient presents with   Hypertension    Paula Ali 68 y.o. female presents to office today for follow up to HTN  She has a past medical history of Hypertension and osteoporosis (repeat Bone Density in December)  Blood pressure at home Systolic 130/140s. She forgot to bring her BP log with her today. She has white coat syndrome with primary HTN. She is currently taking  atacand  6 mg, spironolactone  25 mg daily and metoprolol  50 mg daily. She politely declines increasing candesartan  today. States she inadvertently took 8mg  previously and it made her have brain fog and feel out of it.  BP Readings from Last 3 Encounters:  03/17/24 (!) 152/79  12/17/23 (!) 188/66  03/02/23 (!) 183/75     Review of Systems  Constitutional:  Negative for fever, malaise/fatigue and weight loss.  HENT: Negative.  Negative for nosebleeds.   Eyes: Negative.  Negative for blurred vision, double vision and photophobia.  Respiratory: Negative.  Negative for cough and shortness of breath.   Cardiovascular: Negative.  Negative for chest pain, palpitations and leg swelling.  Gastrointestinal: Negative.  Negative for heartburn, nausea and vomiting.  Musculoskeletal: Negative.  Negative for myalgias.  Neurological:  Negative.  Negative for dizziness, focal weakness, seizures and headaches.  Psychiatric/Behavioral: Negative.  Negative for suicidal ideas.     Past Medical History:  Diagnosis Date   Hypertension     Past Surgical History:  Procedure Laterality Date   TONSILLECTOMY      Family History  Problem Relation Age of Onset   Hypertension Mother    Breast cancer Neg Hx     Social History Reviewed with no changes to be made today.   Outpatient Medications Prior to Visit  Medication Sig Dispense Refill   B Complex Vitamins (B-COMPLEX/B-12 PO) Take by mouth.     BL EVENING PRIMROSE OIL PO Take by mouth.     BLACK CURRANT SEED OIL PO Take by mouth.     candesartan  (ATACAND ) 4 MG tablet Take 1 and 1/2 tablets by mouth once daily 135 tablet 1   cholecalciferol (VITAMIN D3) 25 MCG (1000 UNIT) tablet Take 1,000 Units by mouth 5 (five) times daily. Take 5 tablets daily     Cinnamon 500 MG capsule Take 500 mg by mouth daily.     Coenzyme Q10 (CO Q-10) 100 MG CAPS Take by mouth.     Ginger 500 MG CAPS Take by mouth.     metoprolol  succinate (TOPROL -XL) 50 MG 24 hr tablet Take 1 tablet (50 mg total) by mouth daily. 90 tablet 1   Misc Natural Products (DAILY HERBS BREAST HEALTH PO) Take by mouth.     Misc Natural Products (LEG VEIN & CIRCULATION PO) Take by mouth.     Multiple Vitamins-Minerals (WOMENS ONE DAILY PO) Take by mouth.  PHOSPHATIDYL CHOLINE PO Take by mouth.     spironolactone  (ALDACTONE ) 25 MG tablet Take 1 tablet (25 mg total) by mouth daily. 90 tablet 1   Theanine 200 MG CAPS Take by mouth.     Turmeric 500 MG CAPS Take by mouth.     No facility-administered medications prior to visit.    Allergies  Allergen Reactions   Lisinopril  Swelling and Other (See Comments)    Lower lip swelling   Amlodipine  Besylate Other (See Comments)    ACID REFLUX   Amoxicillin    Clonidine      BLURRED VISION    Hydralazine  Hcl     Side effects. 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  Constitutional:      Appearance: She is well-developed.  HENT:     Head: Normocephalic and atraumatic.   Cardiovascular:     Rate and Rhythm: Normal rate and regular rhythm.     Heart sounds: Normal heart sounds. No murmur heard.    No friction rub. No gallop.  Pulmonary:     Effort: Pulmonary effort is normal. No tachypnea or respiratory distress.     Breath sounds: Normal breath sounds. No decreased breath sounds, wheezing, rhonchi or rales.  Chest:     Chest wall: No tenderness.  Abdominal:     General: Bowel sounds are normal.     Palpations: Abdomen is soft.   Musculoskeletal:        General: Normal range of motion.     Cervical back: Normal range of motion.   Skin:    General: Skin is warm and dry.   Neurological:     Mental Status: She is alert and oriented to person, place, and time.     Coordination: Coordination normal.   Psychiatric:        Behavior: Behavior normal. Behavior is cooperative.        Thought Content: Thought content normal.        Judgment: Judgment normal.          Patient has been counseled extensively about nutrition and exercise as well as the importance of adherence with medications and regular follow-up. The patient was given clear instructions to go to ER or return to medical center if symptoms don't improve, worsen or new problems develop. The patient verbalized understanding.   Follow-up: Return in about 14 weeks (around 06/23/2024).   Paula LELON Servant,  FNP-BC [facility] and [facility] [place], [place] [phone]   03/17/2024, 4:14 PM

## 2024-03-18 LAB — CMP14+EGFR
ALT: 17 IU/L (ref 0–32)
AST: 18 IU/L (ref 0–40)
Albumin: 4.4 g/dL (ref 3.9–4.9)
Alkaline Phosphatase: 82 IU/L (ref 44–121)
BUN/Creatinine Ratio: 11 — ABNORMAL LOW (ref 12–28)
BUN: 9 mg/dL (ref 8–27)
Bilirubin Total: 0.4 mg/dL (ref 0.0–1.2)
CO2: 20 mmol/L (ref 20–29)
Calcium: 9.9 mg/dL (ref 8.7–10.3)
Chloride: 100 mmol/L (ref 96–106)
Creatinine, Ser: 0.85 mg/dL (ref 0.57–1.00)
Globulin, Total: 2 g/dL (ref 1.5–4.5)
Glucose: 95 mg/dL (ref 70–99)
Potassium: 4.8 mmol/L (ref 3.5–5.2)
Sodium: 135 mmol/L (ref 134–144)
Total Protein: 6.4 g/dL (ref 6.0–8.5)
eGFR: 74 mL/min/{1.73_m2} (ref >59)

## 2024-03-21 ENCOUNTER — Ambulatory Visit: Payer: Self-pay | Admitting: Nurse Practitioner

## 2024-05-18 ENCOUNTER — Other Ambulatory Visit: Payer: Self-pay | Admitting: Nurse Practitioner

## 2024-05-18 DIAGNOSIS — I10 Essential (primary) hypertension: Secondary | ICD-10-CM

## 2024-06-15 ENCOUNTER — Other Ambulatory Visit: Payer: Self-pay | Admitting: Nurse Practitioner

## 2024-06-15 DIAGNOSIS — I10 Essential (primary) hypertension: Secondary | ICD-10-CM

## 2024-06-18 ENCOUNTER — Telehealth: Payer: Self-pay | Admitting: Nurse Practitioner

## 2024-06-18 NOTE — Telephone Encounter (Signed)
LVM for pt with appt details.

## 2024-06-23 ENCOUNTER — Ambulatory Visit: Admitting: Nurse Practitioner

## 2024-06-23 ENCOUNTER — Encounter: Payer: Self-pay | Admitting: Nurse Practitioner

## 2024-06-23 ENCOUNTER — Ambulatory Visit: Attending: Nurse Practitioner | Admitting: Nurse Practitioner

## 2024-06-23 VITALS — BP 157/73 | HR 72 | Resp 19 | Ht 67.0 in | Wt 167.6 lb

## 2024-06-23 DIAGNOSIS — I10 Essential (primary) hypertension: Secondary | ICD-10-CM

## 2024-06-23 DIAGNOSIS — L819 Disorder of pigmentation, unspecified: Secondary | ICD-10-CM

## 2024-06-23 DIAGNOSIS — E7800 Pure hypercholesterolemia, unspecified: Secondary | ICD-10-CM | POA: Diagnosis not present

## 2024-06-23 NOTE — Progress Notes (Signed)
 Assessment & Plan:  Paula Ali was seen today for hypertension.  Diagnoses and all orders for this visit:  Primary hypertension -     CMP14+EGFR Blood pressure generally well-controlled at home with occasional elevations. Current medication effective. - Continue current antihypertensive regimen with candesartan . - Check kidney function due to candesartan  use.   Hypercholesterolemia -     Lipid panel  Hyperpigmentation of skin -     Ambulatory referral to Dermatology Persistent rash on nasal bridge. Previous topical treatment was inconsistent. - Refer to dermatology for evaluation and management. - Advise consistent use of topical therapy for several weeks to months.   General Health Maintenance Routine health maintenance discussed, including bone density screening and lipid panel. She is taking calcium and vitamin D  and has a history of osteoporosis and declines prescription management - Schedule Medicare physical for January. - Order CMP to check sodium, potassium, kidney, and liver function. - Order lipid panel for cholesterol assessment. - Plan bone density screening for next year. - Continue calcium and vitamin D  supplementation.  Patient has been counseled on age-appropriate routine health concerns for screening and prevention. These are reviewed and up-to-date. Referrals have been placed accordingly. Immunizations are up-to-date or declined.    Subjective:   Chief Complaint  Patient presents with   Hypertension    Paula Ali 68 y.o. female presents to office today for follow up to HTN  She has a past medical history of Hypertension and osteoporosis (repeat Bone Density in December 2026)   She has white coat syndrome with primary HTN. She is currently taking  atacand  6 mg, spironolactone  25 mg daily and metoprolol  50 mg daily.  We have correlated her blood pressure monitoring device with ours in office in the past and consider her device reliable.   She has her blood pressure device with her and home readings are as follows: 128/70 145/73 128/69 139/77 139/80 122/63 126/60 136/66 129/61 123/62 BP Readings from Last 3 Encounters:  06/23/24 (!) 157/73  03/17/24 (!) 152/79  12/17/23 (!) 188/66     She reports a red rash on her nasal bridge that sometimes seems to extend slightly beyond this area to the right side of her face. Despite using a topical treatment for rosacea a few times, the rash has not completely resolved. She has not consulted a dermatologist for this issue.  She experienced a headache recently lasting two to three days. Excedrin Migraine provided relief. These headaches are less severe than those experienced during menopause, which were significantly more intense.  She has a history of hyponatremia. She discusses using a type of salt she believes helps with sodium levels, although not used daily.    Review of Systems  Constitutional:  Negative for fever, malaise/fatigue and weight loss.  HENT: Negative.  Negative for nosebleeds.   Eyes: Negative.  Negative for blurred vision, double vision and photophobia.  Respiratory: Negative.  Negative for cough and shortness of breath.   Cardiovascular: Negative.  Negative for chest pain, palpitations and leg swelling.  Gastrointestinal: Negative.  Negative for heartburn, nausea and vomiting.  Musculoskeletal: Negative.  Negative for myalgias.  Skin:  Positive for rash.       See HPI  Neurological: Negative.  Negative for dizziness, focal weakness, seizures and headaches.  Psychiatric/Behavioral: Negative.  Negative for suicidal ideas.     Past Medical History:  Diagnosis Date   Hypertension     Past Surgical History:  Procedure Laterality Date   TONSILLECTOMY  Family History  Problem Relation Age of Onset   Hypertension Mother    Breast cancer Neg Hx     Social History Reviewed with no changes to be made today.   Outpatient Medications Prior to  Visit  Medication Sig Dispense Refill   B Complex Vitamins (B-COMPLEX/B-12 PO) Take by mouth.     BL EVENING PRIMROSE OIL PO Take by mouth.     BLACK CURRANT SEED OIL PO Take by mouth.     candesartan  (ATACAND ) 4 MG tablet TAKE 1 & 1/2 (ONE & ONE-HALF) TABLETS BY MOUTH ONCE DAILY 135 tablet 0   cholecalciferol (VITAMIN D3) 25 MCG (1000 UNIT) tablet Take 1,000 Units by mouth 5 (five) times daily. Take 5 tablets daily     Cinnamon 500 MG capsule Take 500 mg by mouth daily.     Coenzyme Q10 (CO Q-10) 100 MG CAPS Take by mouth.     Ginger 500 MG CAPS Take by mouth.     metoprolol  succinate (TOPROL -XL) 50 MG 24 hr tablet Take 1 tablet by mouth once daily 90 tablet 1   Misc Natural Products (DAILY HERBS BREAST HEALTH PO) Take by mouth.     Misc Natural Products (LEG VEIN & CIRCULATION PO) Take by mouth.     Multiple Vitamins-Minerals (WOMENS ONE DAILY PO) Take by mouth.     PHOSPHATIDYL CHOLINE PO Take by mouth.     spironolactone  (ALDACTONE ) 25 MG tablet Take 1 tablet (25 mg total) by mouth daily. 90 tablet 1   Theanine 200 MG CAPS Take by mouth.     Turmeric 500 MG CAPS Take by mouth.     No facility-administered medications prior to visit.    Allergies  Allergen Reactions   Lisinopril  Swelling and Other (See Comments)    Lower lip swelling   Amlodipine  Besylate Other (See Comments)    ACID REFLUX   Amoxicillin    Clonidine      BLURRED VISION    Hydralazine  Hcl     Side effects. 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  Constitutional:      Appearance: She is well-developed.  HENT:     Head: Normocephalic and atraumatic.  Cardiovascular:     Rate and Rhythm: Normal rate and regular rhythm.     Heart sounds: Normal heart sounds. No murmur heard.    No friction rub. No gallop.  Pulmonary:     Effort: Pulmonary effort is normal. No tachypnea or respiratory distress.     Breath sounds: Normal breath sounds. No decreased breath sounds, wheezing, rhonchi or rales.  Chest:     Chest wall: No tenderness.  Abdominal:     General: Bowel sounds are normal.     Palpations: Abdomen is soft.  Musculoskeletal:        General: Normal range of motion.  Cervical back: Normal range of motion.  Skin:    General: Skin is warm and dry.  Neurological:     Mental Status: She is alert and oriented to person, place, and time.     Coordination: Coordination normal.  Psychiatric:        Behavior: Behavior normal. Behavior is cooperative.        Thought Content: Thought content normal.        Judgment: Judgment normal.          Patient has been counseled extensively about nutrition and exercise as well as the importance of adherence with medications and regular follow-up. The patient was given clear instructions to go to ER or return to medical center if symptoms don't improve, worsen or new problems develop. The patient verbalized understanding.   Follow-up: Return in about 3 months (around 09/26/2024) for physical.   Haze LELON Servant, FNP-BC [facility] and [facility] [place], [place] [phone]   06/23/2024, 1:55 PM

## 2024-06-24 ENCOUNTER — Ambulatory Visit: Payer: Self-pay | Admitting: Nurse Practitioner

## 2024-06-24 DIAGNOSIS — E875 Hyperkalemia: Secondary | ICD-10-CM

## 2024-06-24 LAB — LIPID PANEL
Chol/HDL Ratio: 4.6 ratio — ABNORMAL HIGH (ref 0.0–4.4)
Cholesterol, Total: 213 mg/dL — ABNORMAL HIGH (ref 100–199)
HDL: 46 mg/dL (ref >39)
LDL Chol Calc (NIH): 147 mg/dL — ABNORMAL HIGH (ref 0–99)
Triglycerides: 111 mg/dL (ref 0–149)
VLDL Cholesterol Cal: 20 mg/dL (ref 5–40)

## 2024-06-24 LAB — CMP14+EGFR
ALT: 14 IU/L (ref 0–32)
AST: 20 IU/L (ref 0–40)
Albumin: 4.2 g/dL (ref 3.9–4.9)
Alkaline Phosphatase: 79 IU/L (ref 49–135)
BUN/Creatinine Ratio: 9 — ABNORMAL LOW (ref 12–28)
BUN: 8 mg/dL (ref 8–27)
Bilirubin Total: 0.5 mg/dL (ref 0.0–1.2)
CO2: 21 mmol/L (ref 20–29)
Calcium: 9.6 mg/dL (ref 8.7–10.3)
Chloride: 102 mmol/L (ref 96–106)
Creatinine, Ser: 0.93 mg/dL (ref 0.57–1.00)
Globulin, Total: 2.7 g/dL (ref 1.5–4.5)
Glucose: 91 mg/dL (ref 70–99)
Potassium: 5.7 mmol/L — ABNORMAL HIGH (ref 3.5–5.2)
Sodium: 137 mmol/L (ref 134–144)
Total Protein: 6.9 g/dL (ref 6.0–8.5)
eGFR: 67 mL/min/1.73 (ref >59)

## 2024-06-25 ENCOUNTER — Encounter: Payer: Self-pay | Admitting: Nurse Practitioner

## 2024-08-01 NOTE — Progress Notes (Signed)
 Danyle Boening                                          MRN: [id number]   08/01/2024   The [facility] Quality Team Specialist reviewed this patient medical record for the purposes of chart review for care gap closure. The following were reviewed: chart review for care gap closure-controlling blood pressure.    [facility] Quality Team

## 2024-08-12 ENCOUNTER — Other Ambulatory Visit: Payer: Self-pay | Admitting: Nurse Practitioner

## 2024-08-12 DIAGNOSIS — I10 Essential (primary) hypertension: Secondary | ICD-10-CM

## 2024-08-28 NOTE — Progress Notes (Signed)
 Paula Ali                                          MRN: [id number]   08/28/2024   The [facility] Quality Team Specialist reviewed this patient medical record for the purposes of chart review for care gap closure. The following were reviewed: chart review for care gap closure-controlling blood pressure.    [facility] Quality Team

## 2024-09-26 ENCOUNTER — Ambulatory Visit: Admitting: Nurse Practitioner

## 2024-10-07 NOTE — Progress Notes (Signed)
 Paula Ali                                          MRN: [id number]   10/07/2024   The [facility] Quality Team Specialist reviewed this patient medical record for the purposes of chart review for care gap closure. The following were reviewed: chart review for care gap closure-controlling blood pressure.    [facility] Quality Team

## 2024-10-20 ENCOUNTER — Ambulatory Visit: Payer: Self-pay | Admitting: Nurse Practitioner

## 2024-12-09 ENCOUNTER — Ambulatory Visit: Admitting: Nurse Practitioner

## 2025-01-26 ENCOUNTER — Ambulatory Visit: Admitting: Physician Assistant
